# Patient Record
Sex: Female | Born: 1955 | Race: Black or African American | Hispanic: No | Marital: Married | Smoking: Never smoker
Health system: Southern US, Community
[De-identification: ages and names within clinical notes are randomized; demographics above are authoritative.]

## PROBLEM LIST (undated history)

## (undated) DIAGNOSIS — I2510 Atherosclerotic heart disease of native coronary artery without angina pectoris: Secondary | ICD-10-CM

## (undated) DIAGNOSIS — E785 Hyperlipidemia, unspecified: Secondary | ICD-10-CM

## (undated) DIAGNOSIS — I340 Nonrheumatic mitral (valve) insufficiency: Secondary | ICD-10-CM

## (undated) DIAGNOSIS — Z9289 Personal history of other medical treatment: Secondary | ICD-10-CM

## (undated) DIAGNOSIS — I10 Essential (primary) hypertension: Secondary | ICD-10-CM

## (undated) DIAGNOSIS — I459 Conduction disorder, unspecified: Secondary | ICD-10-CM

## (undated) HISTORY — DX: Nonrheumatic mitral (valve) insufficiency: I34.0

## (undated) HISTORY — DX: Hyperlipidemia, unspecified: E78.5

## (undated) HISTORY — DX: Conduction disorder, unspecified: I45.9

## (undated) HISTORY — DX: Personal history of other medical treatment: Z92.89

## (undated) HISTORY — DX: Atherosclerotic heart disease of native coronary artery without angina pectoris: I25.10

---

## 2015-11-10 ENCOUNTER — Encounter (HOSPITAL_COMMUNITY): Payer: Self-pay

## 2015-11-10 ENCOUNTER — Inpatient Hospital Stay (HOSPITAL_COMMUNITY)
Admission: EM | Admit: 2015-11-10 | Discharge: 2015-11-12 | DRG: 244 | Disposition: A | Discharge status: Home / Self Care | Payer: Self-pay | Attending: Cardiology | Admitting: Cardiology

## 2015-11-10 ENCOUNTER — Emergency Department (HOSPITAL_COMMUNITY): Payer: Self-pay

## 2015-11-10 DIAGNOSIS — Z79899 Other long term (current) drug therapy: Secondary | ICD-10-CM

## 2015-11-10 DIAGNOSIS — Z95810 Presence of automatic (implantable) cardiac defibrillator: Secondary | ICD-10-CM

## 2015-11-10 DIAGNOSIS — Z7902 Long term (current) use of antithrombotics/antiplatelets: Secondary | ICD-10-CM

## 2015-11-10 DIAGNOSIS — Z6836 Body mass index (BMI) 36.0-36.9, adult: Secondary | ICD-10-CM

## 2015-11-10 DIAGNOSIS — I441 Atrioventricular block, second degree: Principal | ICD-10-CM | POA: Diagnosis present

## 2015-11-10 DIAGNOSIS — I2510 Atherosclerotic heart disease of native coronary artery without angina pectoris: Secondary | ICD-10-CM | POA: Diagnosis present

## 2015-11-10 DIAGNOSIS — Z7982 Long term (current) use of aspirin: Secondary | ICD-10-CM

## 2015-11-10 DIAGNOSIS — I119 Hypertensive heart disease without heart failure: Secondary | ICD-10-CM | POA: Diagnosis present

## 2015-11-10 DIAGNOSIS — R42 Dizziness and giddiness: Secondary | ICD-10-CM

## 2015-11-10 DIAGNOSIS — E669 Obesity, unspecified: Secondary | ICD-10-CM | POA: Diagnosis present

## 2015-11-10 DIAGNOSIS — Z950 Presence of cardiac pacemaker: Secondary | ICD-10-CM | POA: Diagnosis not present

## 2015-11-10 DIAGNOSIS — R0789 Other chest pain: Secondary | ICD-10-CM | POA: Diagnosis present

## 2015-11-10 HISTORY — DX: Essential (primary) hypertension: I10

## 2015-11-10 LAB — I-STAT TROPONIN, ED: Troponin i, poc: 0 ng/mL (ref 0.00–0.08)

## 2015-11-10 LAB — BASIC METABOLIC PANEL
Anion gap: 11 (ref 5–15)
BUN: 15 mg/dL (ref 6–20)
CO2: 28 mmol/L (ref 22–32)
CREATININE: 0.78 mg/dL (ref 0.44–1.00)
Calcium: 10.2 mg/dL (ref 8.9–10.3)
Chloride: 101 mmol/L (ref 101–111)
Glucose, Bld: 118 mg/dL — ABNORMAL HIGH (ref 65–99)
POTASSIUM: 3.6 mmol/L (ref 3.5–5.1)
SODIUM: 140 mmol/L (ref 135–145)

## 2015-11-10 LAB — CBC
HCT: 36 % (ref 36.0–46.0)
Hemoglobin: 12.1 g/dL (ref 12.0–15.0)
MCH: 25.8 pg — ABNORMAL LOW (ref 26.0–34.0)
MCHC: 33.6 g/dL (ref 30.0–36.0)
MCV: 76.8 fL — ABNORMAL LOW (ref 78.0–100.0)
PLATELETS: 282 10*3/uL (ref 150–400)
RBC: 4.69 MIL/uL (ref 3.87–5.11)
RDW: 14.3 % (ref 11.5–15.5)
WBC: 6.9 10*3/uL (ref 4.0–10.5)

## 2015-11-10 NOTE — ED Notes (Signed)
Lequita HaltMorgan, charge RN aware of pts HR. Pt to be taken to next available room.

## 2015-11-10 NOTE — ED Notes (Signed)
Lequita HaltMorgan, charge RN informed Dr. Ethelda ChickJacubowitz of pts HR.

## 2015-11-10 NOTE — ED Provider Notes (Signed)
CSN: [id number]     Arrival date & time 11/10/15  2248 History  By signing my name below, I, Arianna Nassar, attest that this documentation has been prepared under the direction and in the presence of Tomasita CrumbleAdeleke Jazmynn Pho, MD. Electronically Signed: Octavia HeirArianna Nassar, ED Scribe. 11/10/2015. 11:54 PM.     Chief Complaint  Patient presents with  . Chest Pain      The history is provided by the patient. No language interpreter was used.   HPI Comments: Christine Cross is a 60 y.o. female who has a hx of HTN and heart murmur presents to the Emergency Department complaining of intermittent, gradual worsening, moderate left sided chest pain onset one week ago. Pt notes associated dizziness, numbness in bilateral legs and palpitations. Pt says she is concerned with her legs feeling very rough. Daughter states pt will occasionally have bradycardia and will usually drink a soda to alleviate it but she did not do that tonight. She currently takes Metoprolol and Amlodipine.  Pt does not have a cardiologist here. Denies any other symptoms.   Past Medical History  Diagnosis Date  . Murmur   . Hypertension   . Angina at rest [facility](HCC)    History reviewed. No pertinent past surgical history. No family history on file. Social History  Substance Use Topics  . Smoking status: Never Smoker   . Smokeless tobacco: None  . Alcohol Use: No   OB History    No data available     Review of Systems  A complete 10 system review of systems was obtained and all systems are negative except as noted in the HPI and PMH.    Allergies  Review of patient's allergies indicates not on file.  Home Medications   Prior to Admission medications   Not on File   Triage vitals: BP 214/87 mmHg  Pulse 35  Temp(Src) 98.3 F (36.8 C) (Oral)  Resp 18  SpO2 97% Physical Exam  Constitutional: She is oriented to person, place, and time. She appears well-developed and well-nourished. No distress.  HENT:  Head: Normocephalic and  atraumatic.  Nose: Nose normal.  Mouth/Throat: Oropharynx is clear and moist. No oropharyngeal exudate.  Eyes: Conjunctivae and EOM are normal. Pupils are equal, round, and reactive to light. No scleral icterus.  Neck: Normal range of motion. Neck supple. No JVD present. No tracheal deviation present. No thyromegaly present.  Cardiovascular: Regular rhythm and normal heart sounds.  Bradycardia present.  Exam reveals no gallop and no friction rub.   No murmur heard. Pulmonary/Chest: Effort normal and breath sounds normal. No respiratory distress. She has no wheezes. She exhibits no tenderness.  Abdominal: Soft. Bowel sounds are normal. She exhibits no distension and no mass. There is no tenderness. There is no rebound and no guarding.  Musculoskeletal: Normal range of motion. She exhibits no edema or tenderness.  Lymphadenopathy:    She has no cervical adenopathy.  Neurological: She is alert and oriented to person, place, and time. No cranial nerve deficit. She exhibits normal muscle tone.  Skin: Skin is warm and dry. No rash noted. No erythema. No pallor.  Nursing note and vitals reviewed.   ED Course  Procedures  DIAGNOSTIC STUDIES: Oxygen Saturation is 97% on RA, normal by my interpretation.  COORDINATION OF CARE:  11:39 PM Discussed treatment plan with pt at bedside and pt agreed to plan.  Labs Review Labs Reviewed  BASIC METABOLIC PANEL - Abnormal; Notable for the following:    Glucose, Bld 118 (*)  All other components within normal limits  CBC - Abnormal; Notable for the following:    MCV 76.8 (*)    MCH 25.8 (*)    All other components within normal limits  I-STAT TROPOININ, ED    Imaging Review Dg Chest Portable 1 View  11/11/2015  CLINICAL DATA:  Sharp left-sided chest pain radiating into the left arm. Onset 3 days ago. Bradycardia. EXAM: PORTABLE CHEST 1 VIEW COMPARISON:  None. FINDINGS: Prominent left heart contours and moderate cardiomegaly. The lungs are clear.   No large effusions.  No pneumothorax. IMPRESSION: Prominent left heart contours, particularly in the region of the pulmonary outflow tract. Moderate cardiomegaly. Electronically Signed   By: Ellery Plunk M.D.   On: 11/11/2015 00:26   I have personally reviewed and evaluated these images and lab results as part of my medical decision-making.   EKG Interpretation   Date/Time:  Thursday November 10 2015 23:01:55 EDT Ventricular Rate:  36 PR Interval:  168 QRS Duration: 84 QT Interval:  518 QTC Calculation: 400 R Axis:   32 Text Interpretation:  2:1 AV block No old tracing to compare Confirmed by  Erroll Luna [phone]) on 11/10/2015 11:27:19 PM      MDM   Final diagnoses:  None     Patient presents to the ED for chest pain, light headedness and BLE numbness all going on for about 1 month.  EKG reveals a 2:1 AV block.  May be due to her metoprolol and amlodipine medications.  She will require admission for further care.  I spoke with cardiology who will evaluate the patient in the ED.  2:34 AM Cardiology has accepted the patient for admission and possible pacemaker placement.  CRITICAL CARE Performed by: Tomasita Crumble   Total critical care time: 40 minutes - bradycardia HR<40  Critical care time was exclusive of separately billable procedures and treating other patients.  Critical care was necessary to treat or prevent imminent or life-threatening deterioration.  Critical care was time spent personally by me on the following activities: development of treatment plan with patient and/or surrogate as well as nursing, discussions with consultants, evaluation of patient's response to treatment, examination of patient, obtaining history from patient or surrogate, ordering and performing treatments and interventions, ordering and review of laboratory studies, ordering and review of radiographic studies, pulse oximetry and re-evaluation of patient's condition.   I personally  performed the services described in this documentation, which was scribed in my presence. The recorded information has been reviewed and is accurate.     Tomasita Crumble, MD 11/11/15 [phone]

## 2015-11-10 NOTE — ED Notes (Signed)
Pt reports sharp, left sided chest pain that radiates down her left arm and into her neck, onset 3 days ago. Pt bradycardiac with rate of 36 at triage. She reports dizziness.

## 2015-11-11 ENCOUNTER — Other Ambulatory Visit: Payer: Self-pay | Admitting: Physician Assistant

## 2015-11-11 ENCOUNTER — Encounter (HOSPITAL_COMMUNITY): Payer: Self-pay | Admitting: Internal Medicine

## 2015-11-11 ENCOUNTER — Inpatient Hospital Stay (HOSPITAL_COMMUNITY): Payer: Self-pay

## 2015-11-11 ENCOUNTER — Encounter (HOSPITAL_COMMUNITY)
Admission: EM | Disposition: A | Discharge status: Home / Self Care | Payer: Self-pay | Source: Home / Self Care | Attending: Cardiology

## 2015-11-11 DIAGNOSIS — R0789 Other chest pain: Secondary | ICD-10-CM

## 2015-11-11 DIAGNOSIS — I119 Hypertensive heart disease without heart failure: Secondary | ICD-10-CM

## 2015-11-11 DIAGNOSIS — R079 Chest pain, unspecified: Secondary | ICD-10-CM

## 2015-11-11 DIAGNOSIS — I441 Atrioventricular block, second degree: Principal | ICD-10-CM

## 2015-11-11 HISTORY — PX: EP IMPLANTABLE DEVICE: SHX172B

## 2015-11-11 LAB — CBC
HCT: 37 % (ref 36.0–46.0)
Hemoglobin: 12.6 g/dL (ref 12.0–15.0)
MCH: 25.9 pg — ABNORMAL LOW (ref 26.0–34.0)
MCHC: 34.1 g/dL (ref 30.0–36.0)
MCV: 76.1 fL — AB (ref 78.0–100.0)
PLATELETS: 247 10*3/uL (ref 150–400)
RBC: 4.86 MIL/uL (ref 3.87–5.11)
RDW: 14.3 % (ref 11.5–15.5)
WBC: 5.6 10*3/uL (ref 4.0–10.5)

## 2015-11-11 LAB — HEPATIC FUNCTION PANEL
ALK PHOS: 66 U/L (ref 38–126)
ALT: 39 U/L (ref 14–54)
AST: 20 U/L (ref 15–41)
Albumin: 3.5 g/dL (ref 3.5–5.0)
Total Bilirubin: 0.5 mg/dL (ref 0.3–1.2)
Total Protein: 6.8 g/dL (ref 6.5–8.1)

## 2015-11-11 LAB — BASIC METABOLIC PANEL
ANION GAP: 11 (ref 5–15)
BUN: 10 mg/dL (ref 6–20)
CALCIUM: 9.5 mg/dL (ref 8.9–10.3)
CHLORIDE: 107 mmol/L (ref 101–111)
CO2: 26 mmol/L (ref 22–32)
Creatinine, Ser: 0.45 mg/dL (ref 0.44–1.00)
GFR calc non Af Amer: >60 mL/min (ref >60)
Glucose, Bld: 125 mg/dL — ABNORMAL HIGH (ref 65–99)
Potassium: 3.2 mmol/L — ABNORMAL LOW (ref 3.5–5.1)
SODIUM: 144 mmol/L (ref 135–145)

## 2015-11-11 LAB — ECHOCARDIOGRAM COMPLETE
Height: 75 in
WEIGHTICAEL: 4675.2 [oz_av]

## 2015-11-11 LAB — PROTIME-INR
INR: 1.06 (ref 0.00–1.49)
PROTHROMBIN TIME: 14 s (ref 11.6–15.2)

## 2015-11-11 LAB — LIPID PANEL
CHOL/HDL RATIO: 6.7 ratio
CHOLESTEROL: 234 mg/dL — AB (ref 0–200)
HDL: 35 mg/dL — AB (ref >40)
LDL CALC: 184 mg/dL — AB (ref 0–99)
TRIGLYCERIDES: 75 mg/dL (ref <150)
VLDL: 15 mg/dL (ref 0–40)

## 2015-11-11 LAB — TROPONIN I
Troponin I: 0.03 ng/mL (ref <0.031)
Troponin I: <0.03 ng/mL (ref <0.031)
Troponin I: 0.1 ng/mL — ABNORMAL HIGH (ref <0.031)

## 2015-11-11 LAB — TSH: TSH: 1.424 u[IU]/mL (ref 0.350–4.500)

## 2015-11-11 LAB — MAGNESIUM: Magnesium: 1.8 mg/dL (ref 1.7–2.4)

## 2015-11-11 LAB — MRSA PCR SCREENING: MRSA BY PCR: NEGATIVE

## 2015-11-11 LAB — T4, FREE: Free T4: 1.1 ng/dL (ref 0.61–1.12)

## 2015-11-11 SURGERY — PACEMAKER IMPLANT
Anesthesia: LOCAL

## 2015-11-11 MED ORDER — METOPROLOL TARTRATE 1 MG/ML IV SOLN
INTRAVENOUS | Status: AC
  Filled 2015-11-11: qty 5

## 2015-11-11 MED ORDER — LIDOCAINE HCL (PF) 1 % IJ SOLN
INTRAMUSCULAR | Status: DC | PRN
  Administered 2015-11-11: 60 mL via INTRADERMAL

## 2015-11-11 MED ORDER — OFF THE BEAT BOOK
Freq: Once | Status: DC
  Filled 2015-11-11: qty 1

## 2015-11-11 MED ORDER — DEXTROSE 5 % IV SOLN
3.0000 g | INTRAVENOUS | Status: AC
  Administered 2015-11-11: 3 g via INTRAVENOUS
  Filled 2015-11-11 (×2): qty 3000

## 2015-11-11 MED ORDER — ACETAMINOPHEN 325 MG PO TABS: 650.0000 mg | ORAL_TABLET | ORAL | Status: DC | PRN

## 2015-11-11 MED ORDER — HYDRALAZINE HCL 20 MG/ML IJ SOLN
10.0000 mg | Freq: Four times a day (QID) | INTRAMUSCULAR | Status: DC | PRN
  Administered 2015-11-11 – 2015-11-12 (×3): 10 mg via INTRAVENOUS
  Filled 2015-11-11 (×4): qty 1

## 2015-11-11 MED ORDER — CEFAZOLIN SODIUM 1-5 GM-% IV SOLN
1.0000 g | Freq: Four times a day (QID) | INTRAVENOUS | Status: AC
Start: 2015-11-11 — End: 2015-11-12
  Administered 2015-11-11 – 2015-11-12 (×3): 1 g via INTRAVENOUS
  Filled 2015-11-11 (×4): qty 50

## 2015-11-11 MED ORDER — ONDANSETRON HCL 4 MG/2ML IJ SOLN: 4.0000 mg | Freq: Four times a day (QID) | INTRAMUSCULAR | Status: DC | PRN

## 2015-11-11 MED ORDER — CHLORHEXIDINE GLUCONATE 4 % EX LIQD: 60.0000 mL | Freq: Once | CUTANEOUS | Status: DC

## 2015-11-11 MED ORDER — SODIUM CHLORIDE 0.9% FLUSH
3.0000 mL | Freq: Two times a day (BID) | INTRAVENOUS | Status: DC
  Administered 2015-11-11: 3 mL via INTRAVENOUS

## 2015-11-11 MED ORDER — SODIUM CHLORIDE 0.9 % IV SOLN: 250.0000 mL | INTRAVENOUS | Status: DC

## 2015-11-11 MED ORDER — TRAZODONE HCL 50 MG PO TABS
50.0000 mg | ORAL_TABLET | Freq: Once | ORAL | Status: AC
  Administered 2015-11-11: 50 mg via ORAL
  Filled 2015-11-11: qty 1

## 2015-11-11 MED ORDER — LIDOCAINE HCL (PF) 1 % IJ SOLN
INTRAMUSCULAR | Status: AC
  Filled 2015-11-11: qty 30

## 2015-11-11 MED ORDER — ACETAMINOPHEN 325 MG PO TABS
325.0000 mg | ORAL_TABLET | ORAL | Status: DC | PRN
  Administered 2015-11-11 – 2015-11-12 (×3): 650 mg via ORAL
  Filled 2015-11-11 (×3): qty 2

## 2015-11-11 MED ORDER — GENTAMICIN SULFATE 40 MG/ML IJ SOLN
INTRAMUSCULAR | Status: AC
  Filled 2015-11-11: qty 2

## 2015-11-11 MED ORDER — HEPARIN (PORCINE) IN NACL 2-0.9 UNIT/ML-% IJ SOLN
INTRAMUSCULAR | Status: DC | PRN
  Administered 2015-11-11: 12:00:00

## 2015-11-11 MED ORDER — SODIUM CHLORIDE 0.9 % IV SOLN
INTRAVENOUS | Status: DC
  Administered 2015-11-11: 10:00:00 via INTRAVENOUS

## 2015-11-11 MED ORDER — MIDAZOLAM HCL 5 MG/5ML IJ SOLN
INTRAMUSCULAR | Status: AC
  Filled 2015-11-11: qty 5

## 2015-11-11 MED ORDER — SODIUM CHLORIDE 0.9% FLUSH: 3.0000 mL | INTRAVENOUS | Status: DC | PRN

## 2015-11-11 MED ORDER — MIDAZOLAM HCL 5 MG/5ML IJ SOLN
INTRAMUSCULAR | Status: DC | PRN
  Administered 2015-11-11 (×5): 1 mg via INTRAVENOUS

## 2015-11-11 MED ORDER — METOPROLOL TARTRATE 1 MG/ML IV SOLN
INTRAVENOUS | Status: DC | PRN
  Administered 2015-11-11: 5 mg via INTRAVENOUS

## 2015-11-11 MED ORDER — FENTANYL CITRATE (PF) 100 MCG/2ML IJ SOLN
INTRAMUSCULAR | Status: DC | PRN
  Administered 2015-11-11: 25 ug via INTRAVENOUS
  Administered 2015-11-11 (×3): 12.5 ug via INTRAVENOUS

## 2015-11-11 MED ORDER — CLOPIDOGREL BISULFATE 75 MG PO TABS
75.0000 mg | ORAL_TABLET | Freq: Every day | ORAL | Status: DC
  Administered 2015-11-12: 09:00:00 75 mg via ORAL
  Filled 2015-11-11 (×2): qty 1

## 2015-11-11 MED ORDER — ASPIRIN EC 81 MG PO TBEC
81.0000 mg | DELAYED_RELEASE_TABLET | Freq: Every day | ORAL | Status: DC
Start: 2015-11-11 — End: 2015-11-12
  Administered 2015-11-12: 81 mg via ORAL
  Filled 2015-11-11 (×2): qty 1

## 2015-11-11 MED ORDER — HEPARIN (PORCINE) IN NACL 2-0.9 UNIT/ML-% IJ SOLN
INTRAMUSCULAR | Status: AC
  Filled 2015-11-11: qty 500

## 2015-11-11 MED ORDER — SODIUM CHLORIDE 0.9 % IR SOLN: 80.0000 mg | Status: DC

## 2015-11-11 MED ORDER — FENTANYL CITRATE (PF) 100 MCG/2ML IJ SOLN
INTRAMUSCULAR | Status: AC
  Filled 2015-11-11: qty 2

## 2015-11-11 MED ORDER — POTASSIUM CHLORIDE CRYS ER 20 MEQ PO TBCR
40.0000 meq | EXTENDED_RELEASE_TABLET | Freq: Once | ORAL | Status: AC
  Administered 2015-11-11: 18:00:00 40 meq via ORAL
  Filled 2015-11-11: qty 2

## 2015-11-11 SURGICAL SUPPLY — 10 items
CABLE SURGICAL S-101-97-12 (CABLE) ×2 IMPLANT
HOVERMATT SINGLE USE (MISCELLANEOUS) ×2 IMPLANT
INGEVITY MRI 7740-45CM (Lead) ×2 IMPLANT
INGEVITY MRI 7741-52CM (Lead) ×2 IMPLANT
LEAD PACING INGEVITY MRI 45CM (Lead) ×1 IMPLANT
LEAD PACING INGEVITY MRI 52CM (Lead) ×1 IMPLANT
PACEMAKER ACCOLADE DR-EL (Pacemaker) ×2 IMPLANT
PAD DEFIB LIFELINK (PAD) ×2 IMPLANT
SHEATH CLASSIC 7F (SHEATH) ×4 IMPLANT
TRAY PACEMAKER INSERTION (PACKS) ×2 IMPLANT

## 2015-11-11 NOTE — Progress Notes (Signed)
Echocardiogram 2D Echocardiogram has been performed.  Dorothey BasemanReel, Murlin Schrieber M 11/11/2015, 10:08 AM

## 2015-11-11 NOTE — Progress Notes (Signed)
Notified Dr. Ladona Ridgelaylor that potassium 3.2 and received orders to give 40 meq po potassium to patient and to hold plavix and aspirin today and restart plavix and aspirin in am tomorrow.

## 2015-11-11 NOTE — Discharge Summary (Signed)
DISCHARGE SUMMARY    Patient ID: Christine Cross,  MRN: [id number], DOB/AGE: 03-Feb-1956 60 y.o.  Admit date: 11/10/2015 Discharge date: 11/12/2015  Primary Care Physician: none in [place] Primary Cardiologist: none in [place] Electrophysiologist: new this admit to Dr. Ladona Ridgel  Primary Discharge Diagnosis:  1. 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 HTN  No Known Allergies   Procedures This Admission:  1.  Implantation of a [employer] dual chamber PPM on 11/11/15 by Dr Ladona Ridgel.  The patient received a [employer] (serial number [id number]) pacemaker with [facility] (serial number [id number]) right atrial lead and a [employer] (serial number [id number]) right ventricular lead There were no immediate post procedure complications. 2.  CXR on 4/15 demonstrated no pneumothorax status post device implantation.   Brief HPI: Christine Cross is a 60 y.o. female was admitted to [facility] 11/10/15 with c/o increasing weakness and fatigue, noted to have 2:1 heart block and admitted for further care and management, ultimately getting PPM implant 11/11/15 by Dr. Ladona Ridgel.  Hospital Course:  The patient is visiting her daughter coming from [place] where she lives permanently, with plans to be in the [place] for about 4-5 months.  She was admitted to ICU for close observation, her home Toprol held. She continued with high degree AVblock with 2:1 and 3:1 conduction with HR in 30's-40's despite holding her Toprol >36hours. Her BP remained stable noting hypertension treated with PRN hydralazine. Her CC is that she came with about a week or longer of generalized fatigue, weakness, that in the last couple days has become notably worse, and getting SOB with minimal exertion, she and her daughter noted her HR to be slow and she came in to be evaluated. She has had dizzy spells upon standing, and reports about a month ago once upon  standing felt everything go black for a few seconds but did not fall or have full syncope The patient had some c/o CP that date back 3 years located it central and can radiate to her L shoulder, it is sharp and shooting, lasts only a moment at a time but can be repetative on/off for a few minutes duration. It can happen at rest or when up and around, is not necessarily exertional or positional but when she has it, lifting or moving her left arm is painful and makes it worse. She had a stress test for this 3 years ago in [place], was started at that time on her current regime of meds but has never had a cardiac cath.  She had negative troponin x2 and has been arranged to have out patient stress testing done.   She had an echo done that noted: - LVEF 60-65%, moderate LVH, normal wall motion, diastolic  dysfunction, indeterminate LV filling pressure, elevated aortic  valve velocities without stenosis, trileaflet aortic valve,  mildly thickened mitral leaflets with moderate MR, mild LAE,  moderate RAE, mild TR, RVSP 30 mmHg + RAP, IVC not visualized.  She was seen in consult by EP and underwent PPM implant by Dr. Ladona Ridgel as noted 11/11/15 with details noted above. The patient was monitored overnight post PPM implant on telemetry noting p synchronous ventricular pacing.  Her device was interrogated the day after her implant with normal device function, CXR post implant is without pneumothorax.  Her PPM implant site is stable without hematoma.  Wound care and activity instructions  were discussed with the patient.  She was seen and examined by Dr. Ladona Ridgelaylor and felt stable for discharge.  Wound check, stress testing and f/u appointments have been arranged.   Physical Exam: Filed Vitals:   11/11/15 2200 11/12/15 0439 11/12/15 0440 11/12/15 0550  BP: 147/55 169/98 169/98 172/76  Pulse: 78     Temp:  97.6 F (36.4 C)    TempSrc:  Oral    Resp: 19  18 23   Height:      Weight:      SpO2: 97% 97%      GEN- The patient is well appearing, alert and oriented x 3 today.   HEENT: normocephalic, atraumatic; sclera clear, conjunctiva pink; hearing intact; oropharynx clear; neck supple, no JVP Lungs- Clear to ausculation bilaterally, normal work of breathing.  No wheezes, rales, rhonchi Heart- Regular rate and rhythm, no murmurs, rubs or gallops, PMI not laterally displaced GI- soft, non-tender, non-distended, bowel sounds present, no hepatosplenomegaly Extremities- no clubbing, cyanosis, or edema; DP/PT/radial pulses 2+ bilaterally MS- no significant deformity or atrophy Skin- warm and dry, no rash or lesion, left chest without hematoma/ecchymosis Psych- euthymic mood, full affect Neuro- no gross deficits   Labs:   Lab Results  Component Value Date   WBC 5.6 11/11/2015   HGB 12.6 11/11/2015   HCT 37.0 11/11/2015   MCV 76.1* 11/11/2015   PLT 247 11/11/2015     Recent Labs Lab 11/11/15 0346 11/11/15 0830  NA  --  144  K  --  3.2*  CL  --  107  CO2  --  26  BUN  --  10  CREATININE  --  0.45  CALCIUM  --  9.5  PROT 6.8  --   BILITOT 0.5  --   ALKPHOS 66  --   ALT 39  --   AST 20  --   GLUCOSE  --  125*    Discharge Medications:    Medication List    STOP taking these medications        metoprolol succinate 50 MG 24 hr tablet  Commonly known as:  TOPROL-XL      TAKE these medications        amLODipine 5 MG tablet  Commonly known as:  NORVASC  Take 5 mg by mouth daily.     aspirin EC 81 MG tablet  Take 81 mg by mouth daily.     carvedilol 6.25 MG tablet  Commonly known as:  COREG  Take 1 tablet (6.25 mg total) by mouth 2 (two) times daily with a meal.     clopidogrel 75 MG tablet  Commonly known as:  PLAVIX  Take 75 mg by mouth daily.     ISOSORBIDE DINITRATE SL  Place 10 mg under the tongue 2 (two) times daily.        Disposition: Home   Follow-up Information    Follow [facility] [address] [facility] On 11/17/2015.   Specialty:   Cardiology   Why:  12:00noon, wound check   Contact information:   [address], [address] [place] [id number] [phone]      [facility] [address] [facility] On 11/21/2015.   Specialty:  Cardiology   Why:  12:30PM, part one of stress test   Contact information:   [address], [address] [place] [id number] [phone]      [facility] [address] [facility] On 11/22/2015.  Specialty:  Cardiology   Why:  2:00PM, part two of stress test   Contact information:   [address], [address] [place] [address] [phone]      Follow up with Lewayne Bunting, MD On 01/17/2016.   Specialty:  Cardiology   Why:  1:45PM   Contact information:   [address] [place] [address] [phone]      Will plan to stop metoprolol and switch to coreg 6. 25 mg twice daily.  Duration of Discharge Encounter: Greater than 30 minutes including physician time.  Signed, Lewayne Bunting, M.D. 11/12/2015 8:37 AM

## 2015-11-11 NOTE — H&P (View-Only) (Signed)
ELECTROPHYSIOLOGY CONSULT NOTE    Patient ID: Christine KehrJoyce Chuang MRN: [id number], DOB/AGE: 03-28-1956 60 y.o.  Admit date: 11/10/2015 Date of Consult: 11/11/2015  Primary Physician: No PCP Per Patient Primary Cardiologist: none (visiting from [place])  Reason for Consultation: heart block, bradycardia  HPI: Christine Cross is a 60 y.o. female is visiting here from [place] for the next 4-885months, has PMHx of HTN and a heart murmur, she has long history of palpitations and stated many years ago on Toprol XL for the palpitations that were well controlled with it.  She reports having no palpitations for so long, about 6 months or so ago she stopped the Toprol and had recurrent palpitations and resumed it about 3-4 months ago.  She reports about 3 years ago starting to feel CP, locates it central and can radiate to her L shoulder, it is sharp and shooting, lasts only a moment at a time but can be repetative on/off for a few minutes duration.  It can happen at rest or when up and around, is not necessarily exertional or positional but when she has it, lifting or moving her left arm is painful and makes it worse.  She had a stress test for this 3 years ago in [place], was started at that time on her current regime of meds but has never had a cardiac cath and her CP continues for the most part unchanged and feel it most days over the years.   [facility] now with about a week or longer of generalized fatigue, weakness, that in the last couple days has become notably worse, and getting SOB with minimal exertion, she and her daughter noted her HR to be slow and she came in to be evaluated.  She has had dizzy spells upon standing, and reports about a month ago once upon standing felt everything go black for a few seconds but did not fall or have full syncope.  No other hx of near syncope or syncope.  On the Toprol her palpitations have been resolved.    She denies any recent fever or illness  Notable  labs: poc Trop 0.00, Trop I 4 hrs after 0.03 LDL 184 TSH and free t4 are wnl CBC/BMET are unremarkable   Past Medical History  Diagnosis Date  . Murmur   . Hypertension   . Angina at [facility](HCC)      Surgical History: History reviewed. No pertinent past surgical history.   Prescriptions prior to admission  Medication Sig Dispense Refill Last Dose  . amLODipine (NORVASC) 5 MG tablet Take 5 mg by mouth daily.   11/10/2015 at Unknown time  . aspirin EC 81 MG tablet Take 81 mg by mouth daily.   11/10/2015 at Unknown time  . clopidogrel (PLAVIX) 75 MG tablet Take 75 mg by mouth daily.   11/09/2015 at Unknown time  . ISOSORBIDE DINITRATE SL Place 10 mg under the tongue 2 (two) times daily.   11/10/2015 at Unknown time  . metoprolol succinate (TOPROL-XL) 50 MG 24 hr tablet Take 50 mg by mouth daily. Take with or immediately following a meal.   11/09/2015 at 2100    Inpatient Medications:  . aspirin EC  81 mg Oral Daily  . clopidogrel  75 mg Oral Daily    Allergies: No Known Allergies  Social History   Social History  . Marital Status: Married    Spouse Name: N/A  . Number of Children: N/A  . Years of Education: N/A  Occupational History  . Not on file.   Social History Main Topics  . Smoking status: Never Smoker   . Smokeless tobacco: Not on file  . Alcohol Use: No  . Drug Use: Not on file  . Sexual Activity: Not on file   Other Topics Concern  . Not on file   Social History Narrative  . No narrative on file     No family history on file.   Review of Systems: All other systems reviewed and are otherwise negative except as noted above.  Physical Exam: Filed Vitals:   11/11/15 0329 11/11/15 0400 11/11/15 0800 11/11/15 0825  BP: 187/57 187/57 188/67   Pulse:  38  40  Temp: 98.2 F (36.8 C)   98.3 F (36.8 C)  TempSrc: Oral   Oral  Resp: Height:      Weight:      SpO2: 94% 95%  95%    GEN- The patient is obese, well appearing, alert and oriented  x 3 today.   HEENT: normocephalic, atraumatic; sclera clear, conjunctiva pink; hearing intact; oropharynx clear; neck supple, no JVP Lymph- no cervical lymphadenopathy Lungs- Clear to ausculation bilaterally, normal work of breathing.  No wheezes, rales, rhonchi Heart- Regular rate and rhythm, bradycardic, 2/6 SM,  No rubs or gallops, PMI not laterally displaced GI- soft, non-tender, non-distended Extremities- no clubbing, cyanosis, or edema MS- no significant deformity or atrophy Skin- warm and dry, no rash or lesion Psych- euthymic mood, full affect Neuro- no gross deficits observed  Labs:   Lab Results  Component Value Date   WBC 6.9 11/10/2015   HGB 12.1 11/10/2015   HCT 36.0 11/10/2015   MCV 76.8* 11/10/2015   PLT 282 11/10/2015     Recent Labs Lab 11/10/15 2310 11/11/15 0346  NA 140  --   K 3.6  --   CL 101  --   CO2 28  --   BUN 15  --   CREATININE 0.78  --   CALCIUM 10.2  --   PROT  --  6.8  BILITOT  --  0.5  ALKPHOS  --  66  ALT  --  39  AST  --  20  GLUCOSE 118*  --       Radiology/Studies: Dg Chest Portable 1 View  11/11/2015  CLINICAL DATA:  Sharp left-sided chest pain radiating into the left arm. Onset 3 days ago. Bradycardia. EXAM: PORTABLE CHEST 1 VIEW COMPARISON:  None. FINDINGS: Prominent left heart contours and moderate cardiomegaly. The lungs are clear.  No large effusions.  No pneumothorax. IMPRESSION: Prominent left heart contours, particularly in the region of the pulmonary outflow tract. Moderate cardiomegaly. Electronically Signed   By: Ellery Plunk M.D.   On: 11/11/2015 00:26    EKG: 2:1 heart block, 35bpm  TELEMETRY: remains generally 2:1 heart block 30's-40's, noted 3:1 conduction as well. Echocardiogram is pending  Assessment and Plan:   1. High degree heart block She has longstanding hx of palpitations that she was started years ago on Toprol for, never anything more specific has been told to her other then "an irregular heart  beat"     She has been on Toprol XL  qPM last dose 11/10/15 night-time     If she remains in heart block towards the afternoon today will need PPM implant, will hold NPO for now     I have asked for echo to be done ASAP  2. HTN  Continue PRN hydralazine  3. CP     Sounds somewhat atypical, reports an abnormal stress test in [place] 3 years ago for same symptom and started on meds, never cathed      poc Trop 0.00, Trop I 4hrs after 0.03         Signed, Francis Dowse, Cordelia Poche 11/11/2015   EP Attending  Patient seen and examined. I have reviewed the findings as noted by Francis Dowse PA-C and concur with her findings. The patient's exam reveals a regular bradycardia. Lungs are clear, abdomen is obese but soft and non-tender and there is no edema. Neuro is non-focal although she is appropriately anxious. ECG demonstrates NSR with 2:1 AV block.  A/P 1. 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 I have discussed the risks/benefits/goals/expectations of PPM insertion and she wishes to proceed.  2. HTN - her blood pressure is quite high and I would anticipate starting coreg after PPM is inserted and stopping toprol. 3. Obesity - she will need to lose weight 4. Chest pain - this has been present for several years and is atypical. I would anticipate she undergo stress testing before she goes back to [place].  Kaydan Wilhoite,M.D. 9:44 AM

## 2015-11-11 NOTE — ED Notes (Signed)
Upon encounter this RN found patient to have HR @ 35 BPM. At this time patient doesn't endorse symptoms, but explains that over the past few days she has experienced dizziness, upper and lower extremity numbness, chest pain, fatigue, and generalized weakness. IV started; Zoll pads applied.

## 2015-11-11 NOTE — Progress Notes (Signed)
Pt asleep O2 sats dropped into the low 80s, attempted to put 2L nasal cannula on pt. Pt refused and educated.

## 2015-11-11 NOTE — Consult Note (Addendum)
ELECTROPHYSIOLOGY CONSULT NOTE    Patient ID: Christine KehrJoyce Alpert MRN: [id number], DOB/AGE: [birth year] 60 y.o.  Admit date: 11/10/2015 Date of Consult: 11/11/2015  Primary Physician: No PCP Per Patient Primary Cardiologist: none (visiting from [place])  Reason for Consultation: heart block, bradycardia  HPI: Christine Cross is a 60 y.o. female is visiting here from [place] for the next 4-35months, has PMHx of HTN and a heart murmur, she has long history of palpitations and stated many years ago on Toprol XL for the palpitations that were well controlled with it.  She reports having no palpitations for so long, about 6 months or so ago she stopped the Toprol and had recurrent palpitations and resumed it about 3-4 months ago.  She reports about 3 years ago starting to feel CP, locates it central and can radiate to her L shoulder, it is sharp and shooting, lasts only a moment at a time but can be repetative on/off for a few minutes duration.  It can happen at rest or when up and around, is not necessarily exertional or positional but when she has it, lifting or moving her left arm is painful and makes it worse.  She had a stress test for this 3 years ago in [place], was started at that time on her current regime of meds but has never had a cardiac cath and her CP continues for the most part unchanged and feel it most days over the years.   [facility] now with about a week or longer of generalized fatigue, weakness, that in the last couple days has become notably worse, and getting SOB with minimal exertion, she and her daughter noted her HR to be slow and she came in to be evaluated.  She has had dizzy spells upon standing, and reports about a month ago once upon standing felt everything go black for a few seconds but did not fall or have full syncope.  No other hx of near syncope or syncope.  On the Toprol her palpitations have been resolved.    She denies any recent fever or illness  Notable  labs: poc Trop 0.00, Trop I 4 hrs after 0.03 LDL 184 TSH and free t4 are wnl CBC/BMET are unremarkable   Past Medical History  Diagnosis Date  . Murmur   . Hypertension   . Angina at rest [facility](HCC)      Surgical History: History reviewed. No pertinent past surgical history.   Prescriptions prior to admission  Medication Sig Dispense Refill Last Dose  . amLODipine (NORVASC) 5 MG tablet Take 5 mg by mouth daily.   11/10/2015 at Unknown time  . aspirin EC 81 MG tablet Take 81 mg by mouth daily.   11/10/2015 at Unknown time  . clopidogrel (PLAVIX) 75 MG tablet Take 75 mg by mouth daily.   11/09/2015 at Unknown time  . ISOSORBIDE DINITRATE SL Place 10 mg under the tongue 2 (two) times daily.   11/10/2015 at Unknown time  . metoprolol succinate (TOPROL-XL) 50 MG 24 hr tablet Take 50 mg by mouth daily. Take with or immediately following a meal.   11/09/2015 at 2100    Inpatient Medications:  . aspirin EC  81 mg Oral Daily  . clopidogrel  75 mg Oral Daily    Allergies: No Known Allergies  Social History   Social History  . Marital Status: Married    Spouse Name: N/A  . Number of Children: N/A  . Years of Education: N/A  Occupational History  . Not on file.   Social History Main Topics  . Smoking status: Never Smoker   . Smokeless tobacco: Not on file  . Alcohol Use: No  . Drug Use: Not on file  . Sexual Activity: Not on file   Other Topics Concern  . Not on file   Social History Narrative  . No narrative on file     Family Hx: HTN  By her mother only, no onther known medical family history  Review of Systems: All other systems reviewed and are otherwise negative except as noted above.  Physical Exam: Filed Vitals:   11/11/15 0329 11/11/15 0400 11/11/15 0800 11/11/15 0825  BP: 187/57 187/57 188/67   Pulse:  38  40  Temp: 98.2 F (36.8 C)   98.3 F (36.8 C)  TempSrc: Oral   Oral  Resp: Height:      Weight:      SpO2: 94% 95%  95%    GEN- The  patient is obese, well appearing, alert and oriented x 3 today.   HEENT: normocephalic, atraumatic; sclera clear, conjunctiva pink; hearing intact; oropharynx clear; neck supple, no JVP Lymph- no cervical lymphadenopathy Lungs- Clear to ausculation bilaterally, normal work of breathing.  No wheezes, rales, rhonchi Heart- Regular rate and rhythm, bradycardic, 2/6 SM,  No rubs or gallops, PMI not laterally displaced GI- soft, non-tender, non-distended Extremities- no clubbing, cyanosis, or edema MS- no significant deformity or atrophy Skin- warm and dry, no rash or lesion Psych- euthymic mood, full affect Neuro- no gross deficits observed  Labs:   Lab Results  Component Value Date   WBC 6.9 11/10/2015   HGB 12.1 11/10/2015   HCT 36.0 11/10/2015   MCV 76.8* 11/10/2015   PLT 282 11/10/2015     Recent Labs Lab 11/10/15 2310 11/11/15 0346  NA 140  --   K 3.6  --   CL 101  --   CO2 28  --   BUN 15  --   CREATININE 0.78  --   CALCIUM 10.2  --   PROT  --  6.8  BILITOT  --  0.5  ALKPHOS  --  66  ALT  --  39  AST  --  20  GLUCOSE 118*  --       Radiology/Studies: Dg Chest Portable 1 View  11/11/2015  CLINICAL DATA:  Sharp left-sided chest pain radiating into the left arm. Onset 3 days ago. Bradycardia. EXAM: PORTABLE CHEST 1 VIEW COMPARISON:  None. FINDINGS: Prominent left heart contours and moderate cardiomegaly. The lungs are clear.  No large effusions.  No pneumothorax. IMPRESSION: Prominent left heart contours, particularly in the region of the pulmonary outflow tract. Moderate cardiomegaly. Electronically Signed   By: Ellery Plunk M.D.   On: 11/11/2015 00:26    EKG: 2:1 heart block, 35bpm  TELEMETRY: remains generally 2:1 heart block 30's-40's, noted 3:1 conduction as well. Echocardiogram is pending  Assessment and Plan:   1. High degree heart block She has longstanding hx of palpitations that she was started years ago on Toprol for, never anything more specific  has been told to her other then "an irregular heart beat"     She has been on Toprol XL  qPM last dose 11/10/15 night-time     If she remains in heart block towards the afternoon today will need PPM implant, will hold NPO for now     I have asked for echo to  be done ASAP  2. HTN     Continue PRN hydralazine  3. CP     Sounds somewhat atypical, reports an abnormal stress test in [place] 3 years ago for same symptom and started on meds, never cathed      poc Trop 0.00, Trop I 4hrs after 0.03         Signed, Francis Dowse, Cordelia Poche 11/11/2015   EP Attending  Patient seen and examined. I have reviewed the findings as noted by Francis Dowse PA-C and concur with her findings. The patient's exam reveals a regular bradycardia. Lungs are clear, abdomen is obese but soft and non-tender and there is no edema. Neuro is non-focal although she is appropriately anxious. ECG demonstrates NSR with 2:1 AV block.  A/P 1. 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 I have discussed the risks/benefits/goals/expectations of PPM insertion and she wishes to proceed.  2. HTN - her blood pressure is quite high and I would anticipate starting coreg after PPM is inserted and stopping toprol. 3. Obesity - she will need to lose weight 4. Chest pain - this has been present for several years and is atypical. I would anticipate she undergo stress testing before she goes back to [place].  Sharlot Gowda Drewey Begue,M.D. 9:44 AM   ADDENDUM: to include family history only Francis Dowse, [place]   Lewayne Bunting, M.D.

## 2015-11-11 NOTE — Progress Notes (Signed)
Orthopedic Tech Progress Note Patient Details:  Christine Cross [birth year] [id number]  Ortho Devices Type of Ortho Device: Arm sling   Saul FordyceJennifer C Valaree Fresquez 11/11/2015, 1:52 PM

## 2015-11-11 NOTE — H&P (Signed)
History & Physical    Patient ID: Christine KehrJoyce Cross MRN: [id number], DOB/AGE: [birth year]   Admit date: 11/10/2015   Primary Physician: No PCP Per Patient Primary Cardiologist: None in [place].   Patient Profile    60 year old [place] woman with a history of HTN, palpitations, and CAD (by stress test only) who presents with 2:1 AV block.   Past Medical History    Past Medical History  Diagnosis Date  . Murmur   . Hypertension   . Angina at rest [facility](HCC)     History reviewed. No pertinent past surgical history.   Allergies  No Known Allergies  History of Present Illness    60 year old [place] woman with a history of HTN, palpitations, and CAD (by stress test only) who presents with 2:1 AV block. Ms. Christine Cross lives in [place] and came to [place] to visit her daughter (a Teacher, early years/prepharmacist) who just had a baby. Ms. Christine Cross does not have any providers in the [place]. She will be staying for about 5 months. She states that she has had chest pain and had a stress test in [place] 3 years ago. It was positive and she was put on ASA, plavix, metoprolol, and nitrates. She did not undergo cath or revascularization. She has continued to have brief episodes of CP (up to 3 seconds in duration) about 3 times per week. Over the past month she has had increased palpitations and occasionally a slow HR. The patients daughter has apparently directed either taking an additional metoprolol or skipping a day (and taking caffeine) if her HR was too low.    She felt progressively worse over the past week, with dizziness, palpitations, pre-syncope, recurrent of her typical fleeting CP, DOE. Today, her CP radiated to her arms and back which is atypical. Her HR was palpated at 34bpm at home. Due to progressive symptoms, she presented to the ER.  On arrival to the ER, she was bradycardic to 35bpm, BNP 214/87, saturating 97% on RA. BP improved somewhat without intervention. Labs were notable for K 3.6, Cr 0.78, POC TnI 0.00, WBC 6.9,  hct 36. CXR demonstated cardiomegaly but no acute process.  ECG demonstrated SR of ~75bpm with 2:1 AV block with resultant ventricular rate of 36 and a narrow QRS.   The patient and daughter deny accidentally taking too many doses of metoprolol. No tick bites. No history of thyroid disease. Patient is right handed without history of clavicular fracture or chest wall radiation. No alcohol or tobacco. The patient thought she was once told she had an abnormal heart rhythm that could lead to a clot, but she denied ever hearing the term "Atrial fibrillation."  Home Medications    Prior to Admission medications   Not on File    Family History    No family history on file.  Social History    Social History   Social History  . Marital Status: Married    Spouse Name: N/A  . Number of Children: N/A  . Years of Education: N/A   Occupational History  . Not on file.   Social History Main Topics  . Smoking status: Never Smoker   . Smokeless tobacco: Not on file  . Alcohol Use: No  . Drug Use: Not on file  . Sexual Activity: Not on file   Other Topics Concern  . Not on file   Social History Narrative  . No narrative on file     Review of Systems    General:  No chills, fever, night sweats or weight changes.  Cardiovascular: See HPI Dermatological: No rash, lesions/masses Respiratory: See HPI Urologic: No hematuria, dysuria Abdominal:   No nausea, vomiting, diarrhea, bright red blood per rectum, melena, or hematemesis Neurologic:  No visual changes, wkns, changes in mental status. All other systems reviewed and are otherwise negative except as noted above.  Physical Exam    Blood pressure 139/73, pulse 34, temperature 98.3 F (36.8 C), temperature source Oral, resp. rate 16, SpO2 100 %.  General: Pleasant, NAD Psych: Normal affect. Neuro: Alert and oriented X 3. Moves all extremities spontaneously. HEENT: Normal  Neck: Supple without bruits or JVD. Lungs:  Resp regular  and unlabored, CTA. Heart: RRR no s3, s4, or murmurs. Abdomen: Soft, non-tender, non-distended, BS + x 4.  Extremities: No clubbing, cyanosis or edema. DP/PT/Radials 2+ and equal bilaterally.  Labs    Troponin [facility] Test)  Recent Labs  11/10/15 2311  TROPIPOC 0.00   No results for input(s): CKTOTAL, CKMB, TROPONINI in the last 72 hours. Lab Results  Component Value Date   WBC 6.9 11/10/2015   HGB 12.1 11/10/2015   HCT 36.0 11/10/2015   MCV 76.8* 11/10/2015   PLT 282 11/10/2015    Recent Labs Lab 11/10/15 2310  NA 140  K 3.6  CL 101  CO2 28  BUN 15  CREATININE 0.78  CALCIUM 10.2  GLUCOSE 118*   No results found for: CHOL, HDL, LDLCALC, TRIG No results found for: [facility]   Radiology Studies    Dg Chest Portable 1 View  11/11/2015  CLINICAL DATA:  Sharp left-sided chest pain radiating into the left arm. Onset 3 days ago. Bradycardia. EXAM: PORTABLE CHEST 1 VIEW COMPARISON:  None. FINDINGS: Prominent left heart contours and moderate cardiomegaly. The lungs are clear.  No large effusions.  No pneumothorax. IMPRESSION: Prominent left heart contours, particularly in the region of the pulmonary outflow tract. Moderate cardiomegaly. Electronically Signed   By: Ellery Plunk M.D.   On: 11/11/2015 00:26    ECG & Cardiac Imaging    ECG demonstrated SR of ~75bpm with 2:1 AV block with resultant ventricular rate of 36 and a narrow QRS.   Assessment & Plan    60 year old [place] woman with a history of HTN, palpitations, and CAD (by stress test only) who presents with 2:1 AV block. No evidence of higher grade block on telemetry and QRS complex is consistently wide. Although she is on metoprolol, she is not on a particularly high dose (and has been on it for years), and so I doubt the metoprolol is playing a major role the the bradycarda. It is more likely that this represents age related disease of the conduction system.   Admit to SDU Hold metoprolol and long  acting antihypertensives PRN IV hydralazine TSH TTE EP consult in AM NPO s/p MN for possible PPM  Signed, Glori Luis, MD 11/11/2015, 1:13 AM

## 2015-11-11 NOTE — Interval H&P Note (Signed)
History and Physical Interval Note:  11/11/2015 10:48 AM  Christine Cross  has presented today for surgery, with the diagnosis of heart block  The various methods of treatment have been discussed with the patient and family. After consideration of risks, benefits and other options for treatment, the patient has consented to  Procedure(s): Pacemaker Implant (N/A) as a surgical intervention .  The patient's history has been reviewed, patient examined, no change in status, stable for surgery.  I have reviewed the patient's chart and labs.  Questions were answered to the patient's satisfaction.     Lewayne BuntingGregg Draedyn Weidinger

## 2015-11-12 ENCOUNTER — Inpatient Hospital Stay (HOSPITAL_COMMUNITY): Payer: Self-pay

## 2015-11-12 ENCOUNTER — Encounter (HOSPITAL_COMMUNITY): Payer: Self-pay | Admitting: Cardiology

## 2015-11-12 DIAGNOSIS — I459 Conduction disorder, unspecified: Secondary | ICD-10-CM

## 2015-11-12 DIAGNOSIS — R42 Dizziness and giddiness: Secondary | ICD-10-CM

## 2015-11-12 DIAGNOSIS — Z950 Presence of cardiac pacemaker: Secondary | ICD-10-CM | POA: Diagnosis not present

## 2015-11-12 HISTORY — DX: Conduction disorder, unspecified: I45.9

## 2015-11-12 LAB — HEMOGLOBIN A1C
HEMOGLOBIN A1C: 5.9 % — AB (ref 4.8–5.6)
MEAN PLASMA GLUCOSE: 123 mg/dL

## 2015-11-12 MED ORDER — CARVEDILOL 6.25 MG PO TABS: 6.2500 mg | ORAL_TABLET | Freq: Two times a day (BID) | ORAL | Status: DC

## 2015-11-12 MED ORDER — AMLODIPINE BESYLATE 5 MG PO TABS
5.0000 mg | ORAL_TABLET | Freq: Every day | ORAL | Status: DC
  Administered 2015-11-12: 08:00:00 5 mg via ORAL
  Filled 2015-11-12: qty 1

## 2015-11-12 MED ORDER — CARVEDILOL 3.125 MG PO TABS
6.2500 mg | ORAL_TABLET | Freq: Two times a day (BID) | ORAL | Status: DC
  Administered 2015-11-12: 08:00:00 6.25 mg via ORAL
  Filled 2015-11-12: qty 2

## 2015-11-12 MED ORDER — YOU HAVE A PACEMAKER BOOK
Freq: Once | Status: AC
  Administered 2015-11-12: 07:00:00
  Filled 2015-11-12: qty 1

## 2015-11-12 MED ORDER — ACETAMINOPHEN 325 MG PO TABS: 325.0000 mg | ORAL_TABLET | ORAL | Status: AC | PRN

## 2015-11-12 MED ORDER — ISOSORBIDE DINITRATE 10 MG PO TABS: 10.0000 mg | ORAL_TABLET | Freq: Two times a day (BID) | ORAL | Status: DC

## 2015-11-12 MED ORDER — ISOSORBIDE DINITRATE 10 MG PO TABS
10.0000 mg | ORAL_TABLET | Freq: Two times a day (BID) | ORAL | Status: DC
  Administered 2015-11-12: 10 mg via ORAL
  Filled 2015-11-12 (×2): qty 1

## 2015-11-12 MED ORDER — ISOSORBIDE DINITRATE 2.5 MG SL SUBL
10.0000 mg | SUBLINGUAL_TABLET | Freq: Two times a day (BID) | SUBLINGUAL | Status: DC
  Filled 2015-11-12: qty 4

## 2015-11-12 NOTE — Discharge Summary (Signed)
Physician Discharge Summary       Patient ID: Christine Cross MRN: [id number] DOB/AGE: 61-Feb-1957 61 y.o.  Admit date: 11/10/2015 Discharge date: 11/12/2015 Primary Cardiologist:  Dr. Ladona Ridgel  Discharge Diagnoses:  Principal Problem:   AV block, 2nd degree Active Problems:   S/P cardiac pacemaker procedure-[facility]   Chest pressure   Hypertensive heart disease without heart failure   Dizziness, secondary to 2:1 block   Discharged Condition: good  Procedures: 11/11/15 placement of PPM [facility] by Dr. Ladona Ridgel.   Hospital Course:   60 year old [place] woman with a history of HTN, palpitations, and CAD (by stress test only) who presents with 2:1 AV block. Christine Cross lives in [place] and came to [place] to visit her daughter (a Teacher, early years/pre) who just had a baby. Christine Cross does not have any providers in the [place]. She will be staying for about 5 months. She states that she has had chest pain and had a stress test in [place] 3 years ago. It was positive and she was put on ASA, plavix, metoprolol, and nitrates. She did not undergo cath or revascularization. She has continued to have brief episodes of CP (up to 3 seconds in duration) about 3 times per week. Over the past month she has had increased palpitations and occasionally a slow HR. The patients daughter has apparently directed either taking an additional metoprolol or skipping a day (and taking caffeine) if her HR was too low.   She felt progressively worse over the past week, with dizziness, palpitations, pre-syncope, recurrent of her typical fleeting CP, DOE. Today, her CP radiated to her arms and back which is atypical. Her HR was palpated at 34bpm at home. Due to progressive symptoms, she presented to the ER. On arrival to the ER, she was bradycardic to 35bpm, BNP 214/87, saturating 97% on RA. BP improved somewhat without intervention. Labs were notable for K 3.6, Cr 0.78, POC TnI 0.00, WBC 6.9, hct 36. CXR demonstated cardiomegaly but  no acute process. ECG demonstrated SR of ~75bpm with 2:1 AV block with resultant ventricular rate of 36 and a narrow QRS.   Off BB symptoms continued.  And she underwent PPM [facility]. Without complications  CXR was stable without complications and pt ambulated without problems.  Her troponin was mildly elevated secondary to PPM.  She was seen by Dr. Ladona Ridgel and felt stable for discharge.  Her PPM settings were evaluated and her impedence was stable.    Consults: cardiology with EP  Significant Diagnostic Studies:  BMP Latest Ref Rng 11/11/2015 11/10/2015  Glucose 65 - 99 mg/dL 409(W) 119(J)  BUN 6 - 20 mg/dL 10 15  Creatinine 4.78 - 1.00 mg/dL 2.95 6.21  Sodium 308 - 145 mmol/L 144 140  Potassium 3.5 - 5.1 mmol/L 3.2(L) 3.6  Chloride 101 - 111 mmol/L 107 101  CO2 22 - 32 mmol/L 26 28  Calcium 8.9 - 10.3 mg/dL 9.5 65.7   CBC Latest Ref Rng 11/11/2015 11/10/2015  WBC 4.0 - 10.5 K/uL 5.6 6.9  Hemoglobin 12.0 - 15.0 g/dL 84.6 96.2  Hematocrit 95.2 - 46.0 % 37.0 36.0  Platelets 150 - 400 K/uL 247 282   Troponin 0.10  Discharge Exam: Blood pressure 172/76, pulse 78, temperature 97.6 F (36.4 C), temperature source Oral, resp. rate 23, height  (1.905 m), weight 292 lb 3.2 oz (132.541 kg), SpO2 97 %.  PE: General:Pleasant affect, NAD Skin:Warm and dry, brisk capillary refill HEENT:normocephalic, sclera clear, mucus membranes moist Heart:S1S2 RRR  without murmur, gallup, rub or click Lungs:clear without rales, rhonchi, or wheezes ZOX:WRUEAbd:soft, non tender, + BS, do not palpate liver spleen or masses Ext:no lower ext edema, 2+ pedal pulses, 2+ radial pulses Neuro:alert and oriented, MAE, follows commands, + facial symmetry Tele: Pacing    Disposition: Home    Medication List    STOP taking these medications        metoprolol succinate 50 MG 24 hr tablet  Commonly known as:  TOPROL-XL      TAKE these medications        acetaminophen 325 MG tablet  Commonly known as:   TYLENOL  Take 1-2 tablets (325-650 mg total) by mouth every 4 (four) hours as needed for mild pain.     amLODipine 5 MG tablet  Commonly known as:  NORVASC  Take 5 mg by mouth daily.     aspirin EC 81 MG tablet  Take 81 mg by mouth daily.     carvedilol 6.25 MG tablet  Commonly known as:  COREG  Take 1 tablet (6.25 mg total) by mouth 2 (two) times daily with a meal.     clopidogrel 75 MG tablet  Commonly known as:  PLAVIX  Take 75 mg by mouth daily.     ISOSORBIDE DINITRATE SL  Place 10 mg under the tongue 2 (two) times daily.       Follow-up Information    [facility] [address] [facility] On 11/17/2015.   Specialty:  Cardiology   Why:  12:00noon, wound check   Contact information:   [address], [address] [place] [id number] [phone]      Follow [facility] with [facility] [address] [facility] On 11/21/2015.   Specialty:  Cardiology   Why:  12:30PM, part one of stress test   Contact information:   [address], [address] [place] [id number] [phone]      [facility] [address] [facility] On 11/22/2015.   Specialty:  Cardiology   Why:  2:00PM, part two of stress test   Contact information:   [address], [address] [place] [id number] [phone]      Follow up with Lewayne BuntingGregg Taylor, MD On 01/17/2016.   Specialty:  Cardiology   Why:  1:45PM   Contact information:   [address] [place] [id number] [phone]        Discharge Instructions: Pacemaker instructions  Heart Healthy Diet  We changed your metoprolol to carvedilol.   Call [phone] if any problems or questions.  For stress test nothing to eat or drink after 9 am that day on 11/22/15.    Signed: Leone BrandINGOLD,LAURA R Nurse Practitioner-Certified [facility]: [facility] 11/12/2015, 8:41 AM  Time spent on discharge : 30 minutes.     EP Attending  Patient seen and examined.  Agree with the findings as noted above. She is stable for discharge. Usual followup.  Leonia ReevesGregg Taylor,M.D.

## 2015-11-12 NOTE — Discharge Instructions (Signed)
° ° °  Supplemental Discharge Instructions for  Pacemaker Patients  Activity No heavy lifting or vigorous activity with your left/right arm for 6 to 8 weeks.  Do not raise your left arm above your head for one week.  Gradually raise your affected arm as drawn below.              11/15/15                    11/16/15                    11/17/15                   11/18/15 __  NO DRIVING for  1 week   ; you may begin driving on  1/61/094/21/17   .  WOUND CARE - Keep the wound area clean and dry.  Do not get this area wet for one week. No showers for one week; you may shower on  11/18/15   . - The tape/steri-strips on your wound will fall off; do not pull them off.  No bandage is needed on the site.  DO  NOT apply any creams, oils, or ointments to the wound area. - If you notice any drainage or discharge from the wound, any swelling or bruising at the site, or you develop a fever > 101? F after you are discharged home, call the office at once.  Special Instructions - You are still able to use cellular telephones; use the ear opposite the side where you have your pacemaker.  Avoid carrying your cellular phone near your device. - When traveling through airports, show security personnel your identification card to avoid being screened in the metal detectors.  Ask the security personnel to use the hand wand. - Avoid arc welding equipment, MRI testing (magnetic resonance imaging), TENS units (transcutaneous nerve stimulators).  Call the office for questions about other devices. - Avoid electrical appliances that are in poor condition or are not properly grounded. - Microwave ovens are safe to be near or to operate.    Heart Healthy Diet  We changed your metoprolol to carvedilol.   Call [phone] if any problems or questions.  For stress test nothing to eat or drink after 9 am that day on 11/22/15.

## 2015-11-12 NOTE — Progress Notes (Signed)
Pt profile:  60 year old female from [place]  Presents with 2:1 Block and chest pressure. HR to 35. BB stopped and 2:1 Block continued. Symptomatic with episodic dizziness.  PPM placed.   [employer] (serial number [id number]) pacemaker  Subjective: doing well  Objective: Vital signs in last 24 hours: Temp:  [97.6 F (36.4 C)-98.3 F (36.8 C)] 97.6 F (36.4 C) (04/15 0439) Pulse Rate:  [0-263] 78 (04/14 2200) Resp:  [0-92] 23 (04/15 0550) BP: (137-188)/(45-134) 172/76 mmHg (04/15 0550) SpO2:  [0 %-100 %] 97 % (04/15 0439) Weight change:  Last BM Date: 11/11/15 Intake/Output from previous day: -780 04/14 0701 - 04/15 0700 In: 320 [P.O.:220; I.V.:50; IV Piggyback:50] Out: 1100 [Urine:1100] Intake/Output this shift:    PE: General:Pleasant affect, NAD Skin:Warm and dry, brisk capillary refill HEENT:normocephalic, sclera clear, mucus membranes moist Heart:S1S2 RRR without murmur, gallup, rub or click Lungs:clear without rales, rhonchi, or wheezes EAV:WUJW, non tender, + BS, do not palpate liver spleen or masses Ext:no lower ext edema, 2+ pedal pulses, 2+ radial pulses Neuro:alert and oriented, MAE, follows commands, + facial symmetry Tele:  Pacing   Lab Results:  Recent Labs  11/10/15 2310 11/11/15 0830  WBC 6.9 5.6  HGB 12.1 12.6  HCT 36.0 37.0  PLT 282 247   BMET  Recent Labs  11/10/15 2310 11/11/15 0830  NA 140 144  K 3.6 3.2*  CL 101 107  CO2 28 26  GLUCOSE 118* 125*  BUN 15 10  CREATININE 0.78 0.45  CALCIUM 10.2 9.5    Recent Labs  11/11/15 0830 11/11/15 1435  TROPONINI <0.03 0.10*    Lab Results  Component Value Date   CHOL 234* 11/11/2015   HDL 35* 11/11/2015   LDLCALC 184* 11/11/2015   TRIG 75 11/11/2015   CHOLHDL 6.7 11/11/2015   Lab Results  Component Value Date   HGBA1C 5.9* 11/11/2015     Lab Results  Component Value Date   TSH 1.424 11/11/2015    Hepatic Function Panel  Recent Labs  11/11/15 0346  PROT 6.8    ALBUMIN 3.5  AST 20  ALT 39  ALKPHOS 66  BILITOT 0.5  BILIDIR <0.1*  IBILI NOT CALCULATED    Recent Labs  11/11/15 0346  CHOL 234*   No results for input(s): PROTIME in the last 72 hours.     Studies/Results: Dg Chest Portable 1 View  11/11/2015  CLINICAL DATA:  Lambert Mody left-sided chest pain radiating into the left arm. Onset 3 days ago. Bradycardia. EXAM: PORTABLE CHEST 1 VIEW COMPARISON:  None. FINDINGS: Prominent left heart contours and moderate cardiomegaly. The lungs are clear.  No large effusions.  No pneumothorax. IMPRESSION: Prominent left heart contours, particularly in the region of the pulmonary outflow tract. Moderate cardiomegaly. Electronically Signed   By: Ellery Plunk M.D.   On: 11/11/2015 00:26    Medications: I have reviewed the patient's current medications. Scheduled Meds: . aspirin EC  81 mg Oral Daily  . clopidogrel  75 mg Oral Daily   Continuous Infusions:  PRN Meds:.acetaminophen, hydrALAZINE, ondansetron (ZOFRAN) IV   Assessment/Plan: Principal Problem:   AV block, 2nd degree Active Problems:   S/P cardiac pacemaker procedure-[employer]   Chest pressure   Hypertensive heart disease without heart failure   Dizziness, secondary to 2:1 block  S/p PPM [employer] - CXR Pending HTN- resume BB? And amlodipine  outpt nuc study. Follow up in device clinic in 7-10 days Follow up  with Dr. Ladona Ridgelaylor in  3 months - to check when return to [place]. Pacemaker interrogated.    LOS: 1 day   Time spent with pt. :15 minutes. [facility] PointeNGOLD,LAURA R  Nurse Practitioner Certified Pager [phone] or after 5pm and on weekends call [phone] 11/12/2015, 8:01 AM   EP Attending  Patient seen and examined. Agree with above. See my discharge summary. She is stable for discharge. Her blood pressure has been a little high and we have adjusted her meds so that she will go home on coreg instead of toprol and plan to uptitrate as an outpatient.  Leonia ReevesGregg Alwilda Gilland,M.D.

## 2015-11-14 MED FILL — Gentamicin Sulfate Inj 40 MG/ML: INTRAMUSCULAR | Qty: 2 | Status: AC

## 2015-11-14 MED FILL — Sodium Chloride Irrigation Soln 0.9%: Qty: 1000 | Status: AC

## 2015-11-16 ENCOUNTER — Telehealth (HOSPITAL_COMMUNITY): Payer: Self-pay | Admitting: *Deleted

## 2015-11-16 NOTE — Telephone Encounter (Signed)
Patient given detailed instructions per Myocardial Perfusion Study Information Sheet for the test on 11/21/15. Patient notified to arrive 15 minutes early and that it is imperative to arrive on time for appointment to keep from having the test rescheduled.  If you need to cancel or reschedule your appointment, please call the office within 24 hours of your appointment. Failure to do so may result in a cancellation of your appointment, and a $50 no show fee. Patient verbalized understanding.Arush Gatliff J Mechel Schutter, RN   

## 2015-11-16 NOTE — Progress Notes (Signed)
This encounter was created in error - please disregard.

## 2015-11-17 ENCOUNTER — Ambulatory Visit (INDEPENDENT_AMBULATORY_CARE_PROVIDER_SITE_OTHER): Payer: Self-pay | Admitting: *Deleted

## 2015-11-17 ENCOUNTER — Encounter: Payer: Self-pay | Admitting: Cardiology

## 2015-11-17 ENCOUNTER — Encounter: Payer: Self-pay | Admitting: Internal Medicine

## 2015-11-17 DIAGNOSIS — I441 Atrioventricular block, second degree: Secondary | ICD-10-CM

## 2015-11-18 LAB — CUP PACEART INCLINIC DEVICE CHECK
Date Time Interrogation Session: 20170420040000
Implantable Lead Implant Date: 20170414
Implantable Lead Location: 753860
Implantable Lead Model: 7740
Implantable Lead Model: 7741
Implantable Lead Serial Number: 662731
Lead Channel Impedance Value: 675 Ohm
Lead Channel Impedance Value: 758 Ohm
Lead Channel Pacing Threshold Amplitude: 0.8 V
Lead Channel Pacing Threshold Pulse Width: 0.4 ms
Lead Channel Sensing Intrinsic Amplitude: 25 mV
Lead Channel Sensing Intrinsic Amplitude: 8.7 mV
Lead Channel Setting Pacing Amplitude: 3.5 V
Lead Channel Setting Sensing Sensitivity: 2.5 mV
MDC IDC LEAD IMPLANT DT: 20170414
MDC IDC LEAD LOCATION: 753859
MDC IDC LEAD SERIAL: 754519
MDC IDC MSMT LEADCHNL RA PACING THRESHOLD AMPLITUDE: 0.8 V
MDC IDC MSMT LEADCHNL RV PACING THRESHOLD PULSEWIDTH: 0.4 ms
MDC IDC SET LEADCHNL RV PACING AMPLITUDE: 1.4 V
MDC IDC SET LEADCHNL RV PACING PULSEWIDTH: 0.4 ms
Pulse Gen Serial Number: 743775

## 2015-11-18 NOTE — Progress Notes (Signed)
Wound check appointment. Steri-strips removed. Wound without redness or edema. Incision edges approximated, wound well healed. Normal device function. Thresholds, sensing, and impedances consistent with implant measurements. Device programmed at 3.5V in RA for extra safety margin until 3 month visit, RV auto cap programmed on at implant. Histogram distribution appropriate for patient and level of activity. No mode switches or high ventricular rates noted. "PMT" episodes recorded--no true PMT. Patient educated about wound care, arm mobility, lifting restrictions, and remote monitoring. ROV in 3 months with GT.

## 2015-11-21 ENCOUNTER — Ambulatory Visit (HOSPITAL_COMMUNITY): Payer: Self-pay | Attending: Cardiovascular Disease

## 2015-11-21 DIAGNOSIS — R079 Chest pain, unspecified: Secondary | ICD-10-CM | POA: Insufficient documentation

## 2015-11-21 DIAGNOSIS — R9439 Abnormal result of other cardiovascular function study: Secondary | ICD-10-CM | POA: Insufficient documentation

## 2015-11-21 DIAGNOSIS — I119 Hypertensive heart disease without heart failure: Secondary | ICD-10-CM | POA: Insufficient documentation

## 2015-11-21 MED ORDER — REGADENOSON 0.4 MG/5ML IV SOLN
0.4000 mg | Freq: Once | INTRAVENOUS | Status: AC
  Administered 2015-11-21: 0.4 mg via INTRAVENOUS

## 2015-11-21 MED ORDER — TECHNETIUM TC 99M SESTAMIBI GENERIC - CARDIOLITE
32.8000 | Freq: Once | INTRAVENOUS | Status: AC | PRN
  Administered 2015-11-21: 32.8 via INTRAVENOUS

## 2015-11-22 ENCOUNTER — Ambulatory Visit (HOSPITAL_COMMUNITY): Payer: Self-pay | Attending: Cardiology

## 2015-11-22 LAB — MYOCARDIAL PERFUSION IMAGING
CHL CUP NUCLEAR SSS: 14
CHL CUP RESTING HR STRESS: 66 {beats}/min
CSEPPHR: 78 {beats}/min
LV dias vol: 168 mL (ref 46–106)
LVSYSVOL: 80 mL
NUC STRESS TID: 1.07
RATE: 0.24
SDS: 10
SRS: 4

## 2015-11-22 MED ORDER — TECHNETIUM TC 99M SESTAMIBI GENERIC - CARDIOLITE
31.4000 | Freq: Once | INTRAVENOUS | Status: AC | PRN
  Administered 2015-11-22: 31 via INTRAVENOUS

## 2015-11-24 ENCOUNTER — Telehealth: Payer: Self-pay | Admitting: *Deleted

## 2015-11-24 NOTE — Telephone Encounter (Signed)
-----   Message from [facility] Center[facility]ene[facility] Lynn Ursuy, [place]-C sent at 11/23/2015  1:32 PM EDT ----- Please remind the patient about her visit with Tereso NewcomerScott Weaver on the 28th to discuss her stress test result and f/u on her BP, post-hospital visit.  Thanks, [employer] Corporationenee

## 2015-11-24 NOTE — Progress Notes (Signed)
Cardiology Office Note:    Date:  11/25/2015   ID:  Christine KehrJoyce Cross, DOB 1956/02/15, MRN [id number]  PCP:  No PCP Per Patient  Cardiologist:  Dr. Lewayne BuntingGregg Taylor    Electrophysiologist:  Dr. Lewayne BuntingGregg Taylor   Referring MD: No ref. provider found   Chief Complaint  Patient presents with  . Hospitalization Follow-up    2:1 AVB >> s/p PPM; HTN; Chest pain    History of Present Illness:     Christine Cross is a 60 y.o. [place] female with a hx of HTN, Palpitations, presumed CAD by stress test.  She was visiting her [place] in [place] ([place] 5 mos).  Hx indicates she had an abnormal stress test 3 years ago and was put on ASA, beta-blocker, Plavix and Nitrates.  She did not have a cath.  She has noted brief (x seconds) chest pain a few times each week since.   Admitted 4/13-4/15 with symptomatic 2:1 and 3:1 AVB.  This did not resolved with DC of beta-blocker and she was seen by Dr. Lewayne BuntingGregg Taylor.  PPM was recommended.  Echo demonstrated normal LV function with moderate mitral regurgitation. She underwent implant of dual chamber PPM on 11/11/15.  OP nuclear stress test was recommended to evaluate CP.  This demonstrated inferior and apical defect consistent with prior infarct and mild peri-infarct ischemia; EF 52% with apical hypokinesis.    Returns for FU.  Here with her daughter.  She is doing well.  BP at home 150-160s.  She has some atypical sharp chest pains that are improved since her PPM. She denies dyspnea.  She has fatigue and decreased exercise tol.  But this is much better since her PPM.  She denies orthopnea, PND, edema.     Past Medical History  Diagnosis Date  . Mitral regurgitation   . Hypertension   . CAD (coronary artery disease)     a. presumed CAD - Myoview 4/17 - Low risk stress nuclear study with a large, severe, partially reversible inferior and apical defect consistent with prior infarct and mild peri-infarct ischemia; EF 52 with apical hypokinesis.>> no  angina - [facility]  . Heart block 11/12/2015    [employer] [facility] PPM placed  . HLD (hyperlipidemia)   . History of echocardiogram     a. Echo 4/17 - Moderate LVH, EF 60-65%, normal wall motion, grade 1 diastolic dysfunction, moderate MR, mild LAE, moderate RAE, mild TR, PASP 30 mmHg    Past Surgical History  Procedure Laterality Date  . Ep implantable device N/A 11/11/2015    Procedure: Pacemaker Implant;  Surgeon: Marinus MawGregg W Taylor, MD;  [facility];  Service: Cardiovascular;  Laterality: N/A;    Current Medications: Current Meds  Medication Sig  . acetaminophen (TYLENOL) 325 MG tablet Take 1-2 tablets (325-650 mg total) by mouth every 4 (four) hours as needed for mild pain.  . Amlodipine-Valsartan-HCTZ (EXFORGE HCT) 5-160-12.5 MG TABS Take 2 tablets by mouth daily.  Marland Kitchen. aspirin EC 81 MG tablet Take 81 mg by mouth daily.  . carvedilol (COREG) 12.5 MG tablet Take 1 tablet (12.5 mg total) by mouth 2 (two) times daily with a meal.  . clopidogrel (PLAVIX) 75 MG tablet Take 1 tablet (75 mg total) by mouth daily.  . isosorbide dinitrate (ISORDIL) 2.5 MG SL tablet Place 1 tablet (2.5 mg total) under the tongue every 5 (five) minutes x 3 doses as needed.  . [DISCONTINUED] amLODipine (NORVASC) 5 MG tablet Take 5  mg by mouth daily.  . [DISCONTINUED] Amlodipine-Valsartan-HCTZ (EXFORGE HCT) 5-160-12.5 MG TABS Take 2 tablets by mouth daily.  . [DISCONTINUED] carvedilol (COREG) 6.25 MG tablet Take 1 tablet (6.25 mg total) by mouth 2 (two) times daily with a meal.  . [DISCONTINUED] clopidogrel (PLAVIX) 75 MG tablet Take 75 mg by mouth daily.  . [DISCONTINUED] ISOSORBIDE DINITRATE SL Place 10 mg under the tongue 2 (two) times daily.  . [DISCONTINUED] valsartan-hydrochlorothiazide (DIOVAN-HCT) 160-12.5 MG tablet Take 1 tablet by mouth daily.     Allergies:   Review of patient's allergies indicates no known allergies.   Social History   Social History  . Marital Status: Married    Spouse Name:  N/A  . Number of Children: N/A  . Years of Education: N/A   Social History Main Topics  . Smoking status: Never Smoker   . Smokeless tobacco: None  . Alcohol Use: No  . Drug Use: None  . Sexual Activity: Not Asked   Other Topics Concern  . None   Social History Narrative     Family History:  The patient's family history is not on file.   ROS:   Please see the history of present illness.    Review of Systems  Constitution: Positive for malaise/fatigue and weight loss.  HENT: Positive for headaches.   Cardiovascular: Positive for chest pain and irregular heartbeat.  Respiratory: Positive for shortness of breath.    All other systems reviewed and are negative.   Physical Exam:    VS:  BP 138/90 mmHg  Pulse 64  Ht  (1.702 m)  Wt 288 lb (130.636 kg)  BMI 45.10 kg/m2   GEN: Well nourished, well developed, in no acute distress HEENT: normal Neck: no JVD, no masses Cardiac: Normal S1/S2, RRR; no murmurs, rubs, or gallops, no edema;     Respiratory:  clear to auscultation bilaterally; no wheezing, rhonchi or rales GI: soft, nontender, nondistended MS: no deformity or atrophy Skin: warm and dry Neuro: No focal deficits  Psych: Alert and oriented x 3, normal affect  Wt Readings from Last 3 Encounters:  11/25/15 288 lb (130.636 kg)  11/11/15 292 lb 3.2 oz (132.541 kg)      Studies/Labs Reviewed:     EKG:  EKG is not ordered today.  The ekg ordered today demonstrates n/a  Recent Labs: 11/11/2015: ALT 39; BUN 10; Creatinine, Ser 0.45; Hemoglobin 12.6; Magnesium 1.8; Platelets 247; Potassium 3.2*; Sodium 144; TSH 1.424   Recent Lipid Panel    Component Value Date/Time   CHOL 234* 11/11/2015 0346   TRIG 75 11/11/2015 0346   HDL 35* 11/11/2015 0346   CHOLHDL 6.7 11/11/2015 0346   VLDL 15 11/11/2015 0346   LDLCALC 184* 11/11/2015 0346    Additional studies/ records that were reviewed today include:   Myoview 11/22/15 Low risk stress nuclear study with a  large, severe, partially reversible inferior and apical defect consistent with prior infarct and mild peri-infarct ischemia; EF 52 with apical hypokinesis.  Echo 11/11/15 Moderate LVH, EF 60-65%, normal wall motion, grade 1 diastolic dysfunction, moderate MR, mild LAE, moderate RAE, mild TR, PASP 30 mmHg   ASSESSMENT:     1. Coronary artery disease involving native coronary artery of native heart without angina pectoris   2. Hypertensive heart disease without heart failure   3. Hyperlipidemia   4. Cardiac pacemaker in situ     PLAN:     In order of problems listed above:  1. CAD - She  has presumed CAD based upon abnormal Myoview. I reviewed her case today with Dr. Tenny Craw (DOD). Patient has atypical chest pain. However, she does not seem to have significant symptoms of angina. Exercise tolerance is improved s/p PPM.  Her ejection fraction is normal. Therefore, medical therapy will be pursued. Continue aspirin, Plavix, beta blocker, ARB. She takes nitroglycerin as needed. She is not on statin therapy for unclear reasons. She previously took Crestor. I will place her on Crestor 20 mg daily. She will need follow-up lipids and LFTs in 6 weeks.  2. HTN - Blood pressure better controlled. Still not at target. Increase carvedilol to 12.5 mg twice a day. Continue Exforge-HCT.  3. HL - Start Crestor 20 mg daily. Arrange follow-up lipids and LFTs in 6 weeks.  4. PPM -  FU with EP as planned.    Medication Adjustments/Labs and Tests Ordered: Current medicines are reviewed at length with the patient today.  Concerns regarding medicines are outlined above.  Medication changes, Labs and Tests ordered today are outlined in the Patient Instructions noted below. Patient Instructions  Medication Instructions:  Your physician has recommended you make the following change in your medication:  1. Increase  Coreg ( 12.5 mg ) twice a day, you can use up two of your old tablets twice a day until gone 2. Start  Crestor ( 20 mg ) daily 3. All prescriptions were sent in to [facility] on [address] with a 90 day supply Labwork:  Your physician recommends that you have lab work today: BMET Your physician recommends that you return for a FASTING lipid profile:LFT on the day of appointment with Dr. Ladona Ridgel Testing/Procedures: -None Follow-Up: Your physician recommends that you keep your schedule a follow-up appointment with Dr. Ladona Ridgel in June Any Other Special Instructions Will Be Listed Below (If Applicable). 1. Check Blood Pressure when you get home if not the same as here get an arm cuff and keep track of bp.  If bp is greater than 140 call our office at [phone] If you need a refill on your cardiac medications before your next appointment, please call your pharmacy.     Signed, Tereso Newcomer, PA-C  11/25/2015 2:00 PM    [facility] [address] [place], [place], [place]  [address] Phone: [phone]; Fax: [phone]

## 2015-11-25 ENCOUNTER — Encounter: Payer: Self-pay | Admitting: Physician Assistant

## 2015-11-25 ENCOUNTER — Ambulatory Visit (INDEPENDENT_AMBULATORY_CARE_PROVIDER_SITE_OTHER): Payer: Self-pay | Admitting: Physician Assistant

## 2015-11-25 VITALS — BP 138/90 | HR 64 | Ht 67.0 in | Wt 288.0 lb

## 2015-11-25 DIAGNOSIS — Z950 Presence of cardiac pacemaker: Secondary | ICD-10-CM

## 2015-11-25 DIAGNOSIS — I119 Hypertensive heart disease without heart failure: Secondary | ICD-10-CM

## 2015-11-25 DIAGNOSIS — E785 Hyperlipidemia, unspecified: Secondary | ICD-10-CM

## 2015-11-25 DIAGNOSIS — I2510 Atherosclerotic heart disease of native coronary artery without angina pectoris: Secondary | ICD-10-CM

## 2015-11-25 LAB — BASIC METABOLIC PANEL
BUN: 14 mg/dL (ref 7–25)
CALCIUM: 9.6 mg/dL (ref 8.6–10.4)
CO2: 28 mmol/L (ref 20–31)
Chloride: 102 mmol/L (ref 98–110)
Creat: 0.49 mg/dL — ABNORMAL LOW (ref 0.50–0.99)
GLUCOSE: 87 mg/dL (ref 65–99)
POTASSIUM: 3.4 mmol/L — AB (ref 3.5–5.3)
SODIUM: 139 mmol/L (ref 135–146)

## 2015-11-25 MED ORDER — ISOSORBIDE DINITRATE 2.5 MG SL SUBL: 2.5000 mg | SUBLINGUAL_TABLET | SUBLINGUAL | Status: AC | PRN

## 2015-11-25 MED ORDER — CLOPIDOGREL BISULFATE 75 MG PO TABS: 75.0000 mg | ORAL_TABLET | Freq: Every day | ORAL | Status: DC

## 2015-11-25 MED ORDER — CARVEDILOL 12.5 MG PO TABS: 12.5000 mg | ORAL_TABLET | Freq: Two times a day (BID) | ORAL | Status: DC

## 2015-11-25 MED ORDER — AMLODIPINE-VALSARTAN-HCTZ 5-160-12.5 MG PO TABS: 2.0000 | ORAL_TABLET | Freq: Every day | ORAL | Status: DC

## 2015-11-25 MED ORDER — ROSUVASTATIN CALCIUM 20 MG PO TABS: 20.0000 mg | ORAL_TABLET | Freq: Every day | ORAL | Status: DC

## 2015-11-25 NOTE — Patient Instructions (Addendum)
Medication Instructions:  Your physician has recommended you make the following change in your medication:  1. Increase  Coreg ( 12.5 mg ) twice a day, you can use up two of your old tablets twice a day until gone 2. Start Crestor ( 20 mg ) daily 3. [facility] on [address] with a 90 [address] supply Labwork:  Your physician recommends that you have lab work today: BMET Your physician recommends that you return for a FASTING lipid profile:LFT on the day of appointment with Dr. Ladona Ridgelaylor Testing/Procedures: -None Follow-Up: Your physician recommends that you keep your schedule a follow-up appointment with Dr. Ladona Ridgelaylor in June Any Other Special Instructions Will Be Listed Below (If Applicable). 1. Check Blood Pressure when you get home if not the same as here get an arm cuff and keep track of bp.  If bp is greater than 140 call our office at [phone] If you need a refill on your cardiac medications before your next appointment, please call your pharmacy.

## 2015-11-29 ENCOUNTER — Telehealth: Payer: Self-pay | Admitting: *Deleted

## 2015-11-29 DIAGNOSIS — I119 Hypertensive heart disease without heart failure: Secondary | ICD-10-CM

## 2015-11-29 MED ORDER — POTASSIUM CHLORIDE CRYS ER 20 MEQ PO TBCR
20.0000 meq | EXTENDED_RELEASE_TABLET | Freq: Every day | ORAL | Status: DC
Start: 2015-11-29 — End: 2017-01-04

## 2015-11-29 NOTE — Telephone Encounter (Signed)
Pt notified of lab results and recommendations. Pt agreeable to start K+ 20 meq daily; bmet 5/10. [facility] [place].

## 2015-12-07 ENCOUNTER — Other Ambulatory Visit: Payer: Self-pay | Admitting: *Deleted

## 2015-12-07 DIAGNOSIS — I2510 Atherosclerotic heart disease of native coronary artery without angina pectoris: Secondary | ICD-10-CM

## 2015-12-07 DIAGNOSIS — I119 Hypertensive heart disease without heart failure: Secondary | ICD-10-CM

## 2015-12-07 DIAGNOSIS — E785 Hyperlipidemia, unspecified: Secondary | ICD-10-CM

## 2015-12-07 DIAGNOSIS — Z950 Presence of cardiac pacemaker: Secondary | ICD-10-CM

## 2015-12-07 LAB — BASIC METABOLIC PANEL
BUN: 17 mg/dL (ref 7–25)
CALCIUM: 9.4 mg/dL (ref 8.6–10.4)
CO2: 27 mmol/L (ref 20–31)
Chloride: 103 mmol/L (ref 98–110)
Creat: 0.57 mg/dL (ref 0.50–0.99)
Glucose, Bld: 102 mg/dL — ABNORMAL HIGH (ref 65–99)
Potassium: 3.9 mmol/L (ref 3.5–5.3)
SODIUM: 141 mmol/L (ref 135–146)

## 2015-12-09 ENCOUNTER — Telehealth: Payer: Self-pay | Admitting: *Deleted

## 2015-12-09 NOTE — Telephone Encounter (Signed)
Ptcb and has been notified of her lab results by phone with verbal understanding.

## 2015-12-09 NOTE — Telephone Encounter (Signed)
Lmtcb to go over lab results 

## 2016-01-17 ENCOUNTER — Other Ambulatory Visit: Payer: Self-pay

## 2016-01-17 ENCOUNTER — Ambulatory Visit (INDEPENDENT_AMBULATORY_CARE_PROVIDER_SITE_OTHER): Payer: Self-pay | Admitting: Internal Medicine

## 2016-01-17 ENCOUNTER — Encounter: Payer: Self-pay | Admitting: Internal Medicine

## 2016-01-17 VITALS — BP 136/90 | HR 65 | Ht 66.0 in | Wt 285.6 lb

## 2016-01-17 DIAGNOSIS — I441 Atrioventricular block, second degree: Secondary | ICD-10-CM

## 2016-01-17 MED ORDER — CARVEDILOL 6.25 MG PO TABS
6.2500 mg | ORAL_TABLET | Freq: Two times a day (BID) | ORAL | Status: DC
Start: 2016-01-17 — End: 2017-04-29

## 2016-01-17 NOTE — Progress Notes (Signed)
HPI Christine Cross returns today for followup. She is a pleasant middle aged obese african woman with symptomatic sinus node dysfunction, obesity and HTN. She underwent PPM insertion about 3 months ago. She has been stable in the interim. She denies chest pain or sob. No syncope. She is anxious about returning to [place].  Allergies  Allergen Reactions  . Other Anaphylaxis    SEAFOOD     Current Outpatient Prescriptions  Medication Sig Dispense Refill  . acetaminophen (TYLENOL) 325 MG tablet Take 1-2 tablets (325-650 mg total) by mouth every 4 (four) hours as needed for mild pain.    . Amlodipine-Valsartan-HCTZ (EXFORGE HCT) 5-160-12.5 MG TABS Take 2 tablets by mouth daily. 180 tablet 3  . aspirin EC 81 MG tablet Take 81 mg by mouth daily.    . carvedilol (COREG) 6.25 MG tablet Take 1 tablet by mouth 2 (two) times daily.  6  . clopidogrel (PLAVIX) 75 MG tablet Take 1 tablet (75 mg total) by mouth daily. 90 tablet 3  . isosorbide dinitrate (ISORDIL) 2.5 MG SL tablet Place 1 tablet (2.5 mg total) under the tongue every 5 (five) minutes x 3 doses as needed. 100 tablet 3  . potassium chloride SA (K-DUR,KLOR-CON) 20 MEQ tablet Take 1 tablet (20 mEq total) by mouth daily. 30 tablet 11  . rosuvastatin (CRESTOR) 20 MG tablet Take 1 tablet (20 mg total) by mouth daily. 90 tablet 3   No current facility-administered medications for this visit.     Past Medical History  Diagnosis Date  . Mitral regurgitation   . Hypertension   . CAD (coronary artery disease)     a. presumed CAD - Myoview 4/17 - Low risk stress nuclear study with a large, severe, partially reversible inferior and apical defect consistent with prior infarct and mild peri-infarct ischemia; EF 52 with apical hypokinesis.>> no angina - [facility]  . Heart block 11/12/2015    [employer] [facility] PPM placed  . HLD (hyperlipidemia)   . History of echocardiogram     a. Echo 4/17 - Moderate LVH, EF 60-65%, normal wall motion, grade 1 diastolic  dysfunction, moderate MR, mild LAE, moderate RAE, mild TR, PASP 30 mmHg    ROS:   All systems reviewed and negative except as noted in the HPI.   Past Surgical History  Procedure Laterality Date  . Ep implantable device N/A 11/11/2015    Procedure: Pacemaker Implant;  Surgeon: Marinus MawGregg W Taylor, MD;  [facility];  Service: Cardiovascular;  Laterality: N/A;     No family history on file.   Social History   Social History  . Marital Status: Married    Spouse Name: N/A  . Number of Children: N/A  . Years of Education: N/A   Occupational History  . Not on file.   Social History Main Topics  . Smoking status: Never Smoker   . Smokeless tobacco: Not on file  . Alcohol Use: No  . Drug Use: Not on file  . Sexual Activity: Not on file   Other Topics Concern  . Not on file   Social History Narrative     BP 136/90 mmHg  Pulse 65  Ht 5\' 6"  (1.676 m)  Wt 285 lb 9.6 oz (129.547 kg)  BMI 46.12 kg/m2  Physical Exam:  Well appearing obese middle aged woman, NAD HEENT: Unremarkable Neck:  No JVD, no thyromegally Lymphatics:  No adenopathy Back:  No CVA tenderness Lungs:  Clear with no  wheezes HEART:  Regular rate rhythm, no murmurs, no rubs, no clicks Abd:  soft, positive bowel sounds, no organomegally, no rebound, no guarding Ext:  2 plus pulses, no edema, no cyanosis, no clubbing Skin:  No rashes no nodules Neuro:  CN II through XII intact, motor grossly intact   DEVICE  Normal device function.  See PaceArt for details.   Assess/Plan: 1. Sinus node dysfunction - she is asymptomatic s/p PPM insertion.  2. PPM - her [place] DDD PM is working normally. Will recheck in several months 3. Obesity - i have asked the patient to lose weight. 4. HTN -  Her blood pressure is a bit elevated. She is encouraged to reduce her salt intake and to lose weight.   Leonia Reeves.D.

## 2016-01-17 NOTE — Patient Instructions (Addendum)
Medication Instructions:  Your physician recommends that you continue on your current medications as directed. Please refer to the Current Medication list given to you today.   Labwork: None ordered   Testing/Procedures: None ordered   Follow-Up: Your physician wants you to follow-up in: 12 month follow up with Dr Court Joyaylor You will receive a reminder letter in the mail two months in advance. If you don't receive a letter, please call our office to schedule the follow-up appointment.  Remote monitoring is used to monitor your Pacemaker  from home. This monitoring reduces the number of office visits required to check your device to one time per year. It allows us to keep an eye on the functioning of your device to ensure it is working properly. You are scheduled for a device check from home on 04/17/16. You may send your transmission at any time that day. If you have a wireless device, the transmission will be sent automatically. After your physician reviews your transmission, you will receive a postcard with your next transmission date.    Any Other Special Instructions Will Be Listed Below (If Applicable).     If you need a refill on your cardiac medications before your next appointment, please call your pharmacy.

## 2016-01-18 LAB — CUP PACEART INCLINIC DEVICE CHECK
Implantable Lead Implant Date: 20170414
Implantable Lead Location: 753859
Implantable Lead Model: 7741
Implantable Lead Serial Number: 754519
Lead Channel Impedance Value: 610 Ohm
Lead Channel Impedance Value: 734 Ohm
Lead Channel Pacing Threshold Amplitude: 0.8 V
Lead Channel Pacing Threshold Amplitude: 0.8 V
Lead Channel Pacing Threshold Pulse Width: 0.4 ms
Lead Channel Sensing Intrinsic Amplitude: 7.2 mV
Lead Channel Setting Pacing Pulse Width: 0.4 ms
Lead Channel Setting Sensing Sensitivity: 2.5 mV
MDC IDC LEAD IMPLANT DT: 20170414
MDC IDC LEAD LOCATION: 753860
MDC IDC LEAD SERIAL: 662731
MDC IDC MSMT BATTERY REMAINING LONGEVITY: 168 mo
MDC IDC MSMT LEADCHNL RV PACING THRESHOLD PULSEWIDTH: 0.4 ms
MDC IDC SESS DTM: 20170621103634
MDC IDC SET LEADCHNL RA PACING AMPLITUDE: 3.5 V
MDC IDC SET LEADCHNL RV PACING AMPLITUDE: 1.4 V
MDC IDC STAT BRADY RA PERCENT PACED: 3 %
MDC IDC STAT BRADY RV PERCENT PACED: 99 %
Pulse Gen Serial Number: 743775

## 2016-02-09 DIAGNOSIS — R079 Chest pain, unspecified: Secondary | ICD-10-CM

## 2016-02-13 ENCOUNTER — Encounter: Payer: Self-pay | Admitting: Internal Medicine

## 2016-04-17 ENCOUNTER — Telehealth: Payer: Self-pay | Admitting: Cardiology

## 2016-04-17 ENCOUNTER — Ambulatory Visit (INDEPENDENT_AMBULATORY_CARE_PROVIDER_SITE_OTHER): Payer: Self-pay | Admitting: *Deleted

## 2016-04-17 DIAGNOSIS — I441 Atrioventricular block, second degree: Secondary | ICD-10-CM

## 2016-04-17 LAB — CUP PACEART REMOTE DEVICE CHECK
Battery Remaining Longevity: 162 mo
Brady Statistic RA Percent Paced: 0 %
Date Time Interrogation Session: 20170920122200
Implantable Lead Implant Date: 20170414
Implantable Lead Implant Date: 20170414
Implantable Lead Location: 753859
Implantable Lead Model: 7740
Implantable Lead Serial Number: 662731
Lead Channel Impedance Value: 702 Ohm
Lead Channel Setting Pacing Amplitude: 1.4 V
Lead Channel Setting Pacing Amplitude: 2 V
Lead Channel Setting Pacing Pulse Width: 0.4 ms
MDC IDC LEAD LOCATION: 753860
MDC IDC LEAD SERIAL: 754519
MDC IDC MSMT BATTERY REMAINING PERCENTAGE: 100 %
MDC IDC MSMT LEADCHNL RA IMPEDANCE VALUE: 602 Ohm
MDC IDC MSMT LEADCHNL RA PACING THRESHOLD AMPLITUDE: 0.5 V
MDC IDC MSMT LEADCHNL RA PACING THRESHOLD PULSEWIDTH: 0.4 ms
MDC IDC MSMT LEADCHNL RV PACING THRESHOLD AMPLITUDE: 0.8 V
MDC IDC MSMT LEADCHNL RV PACING THRESHOLD PULSEWIDTH: 0.4 ms
MDC IDC PG SERIAL: 743775
MDC IDC SET LEADCHNL RV SENSING SENSITIVITY: 2.5 mV
MDC IDC STAT BRADY RV PERCENT PACED: 100 %

## 2016-04-17 NOTE — Telephone Encounter (Signed)
Spoke with pt and reminded pt of remote transmission that is due today. Pt verbalized understanding.   

## 2016-04-18 ENCOUNTER — Encounter: Payer: Self-pay | Admitting: Cardiology

## 2016-04-18 NOTE — Progress Notes (Signed)
Remote pacemaker transmission.   

## 2016-07-17 ENCOUNTER — Telehealth: Payer: Self-pay | Admitting: Cardiology

## 2016-07-17 ENCOUNTER — Ambulatory Visit (INDEPENDENT_AMBULATORY_CARE_PROVIDER_SITE_OTHER): Payer: Self-pay | Admitting: *Deleted

## 2016-07-17 DIAGNOSIS — I441 Atrioventricular block, second degree: Secondary | ICD-10-CM

## 2016-07-17 NOTE — Telephone Encounter (Signed)
LMOVM reminding pt to send remote transmission.   

## 2016-07-20 NOTE — Progress Notes (Signed)
Remote pacemaker transmission.   

## 2016-07-25 LAB — CUP PACEART REMOTE DEVICE CHECK
Battery Remaining Longevity: 162 mo
Brady Statistic RV Percent Paced: 100 %
Implantable Lead Implant Date: 20170414
Implantable Lead Location: 753860
Implantable Lead Model: 7740
Implantable Lead Serial Number: 662731
Implantable Pulse Generator Implant Date: 20170414
Lead Channel Impedance Value: 632 Ohm
Lead Channel Pacing Threshold Amplitude: 0.6 V
Lead Channel Pacing Threshold Amplitude: 1 V
Lead Channel Pacing Threshold Pulse Width: 0.4 ms
Lead Channel Pacing Threshold Pulse Width: 0.4 ms
Lead Channel Setting Pacing Amplitude: 2 V
Lead Channel Setting Sensing Sensitivity: 2.5 mV
MDC IDC LEAD IMPLANT DT: 20170414
MDC IDC LEAD LOCATION: 753859
MDC IDC LEAD SERIAL: 754519
MDC IDC MSMT BATTERY REMAINING PERCENTAGE: 100 %
MDC IDC MSMT LEADCHNL RA IMPEDANCE VALUE: 624 Ohm
MDC IDC SESS DTM: 20171220175500
MDC IDC SET LEADCHNL RV PACING AMPLITUDE: 1.5 V
MDC IDC SET LEADCHNL RV PACING PULSEWIDTH: 0.4 ms
MDC IDC STAT BRADY RA PERCENT PACED: 0 %
Pulse Gen Serial Number: 743775

## 2016-12-16 IMAGING — CR DG CHEST 1V PORT
1 series · 1 of 1 positions shown · non-contrast
Comparison: None.

CLINICAL DATA: Sharp left-sided chest pain radiating into the left
arm. Onset 3 days ago. Bradycardia.

EXAM:
PORTABLE CHEST 1 VIEW

[AP]
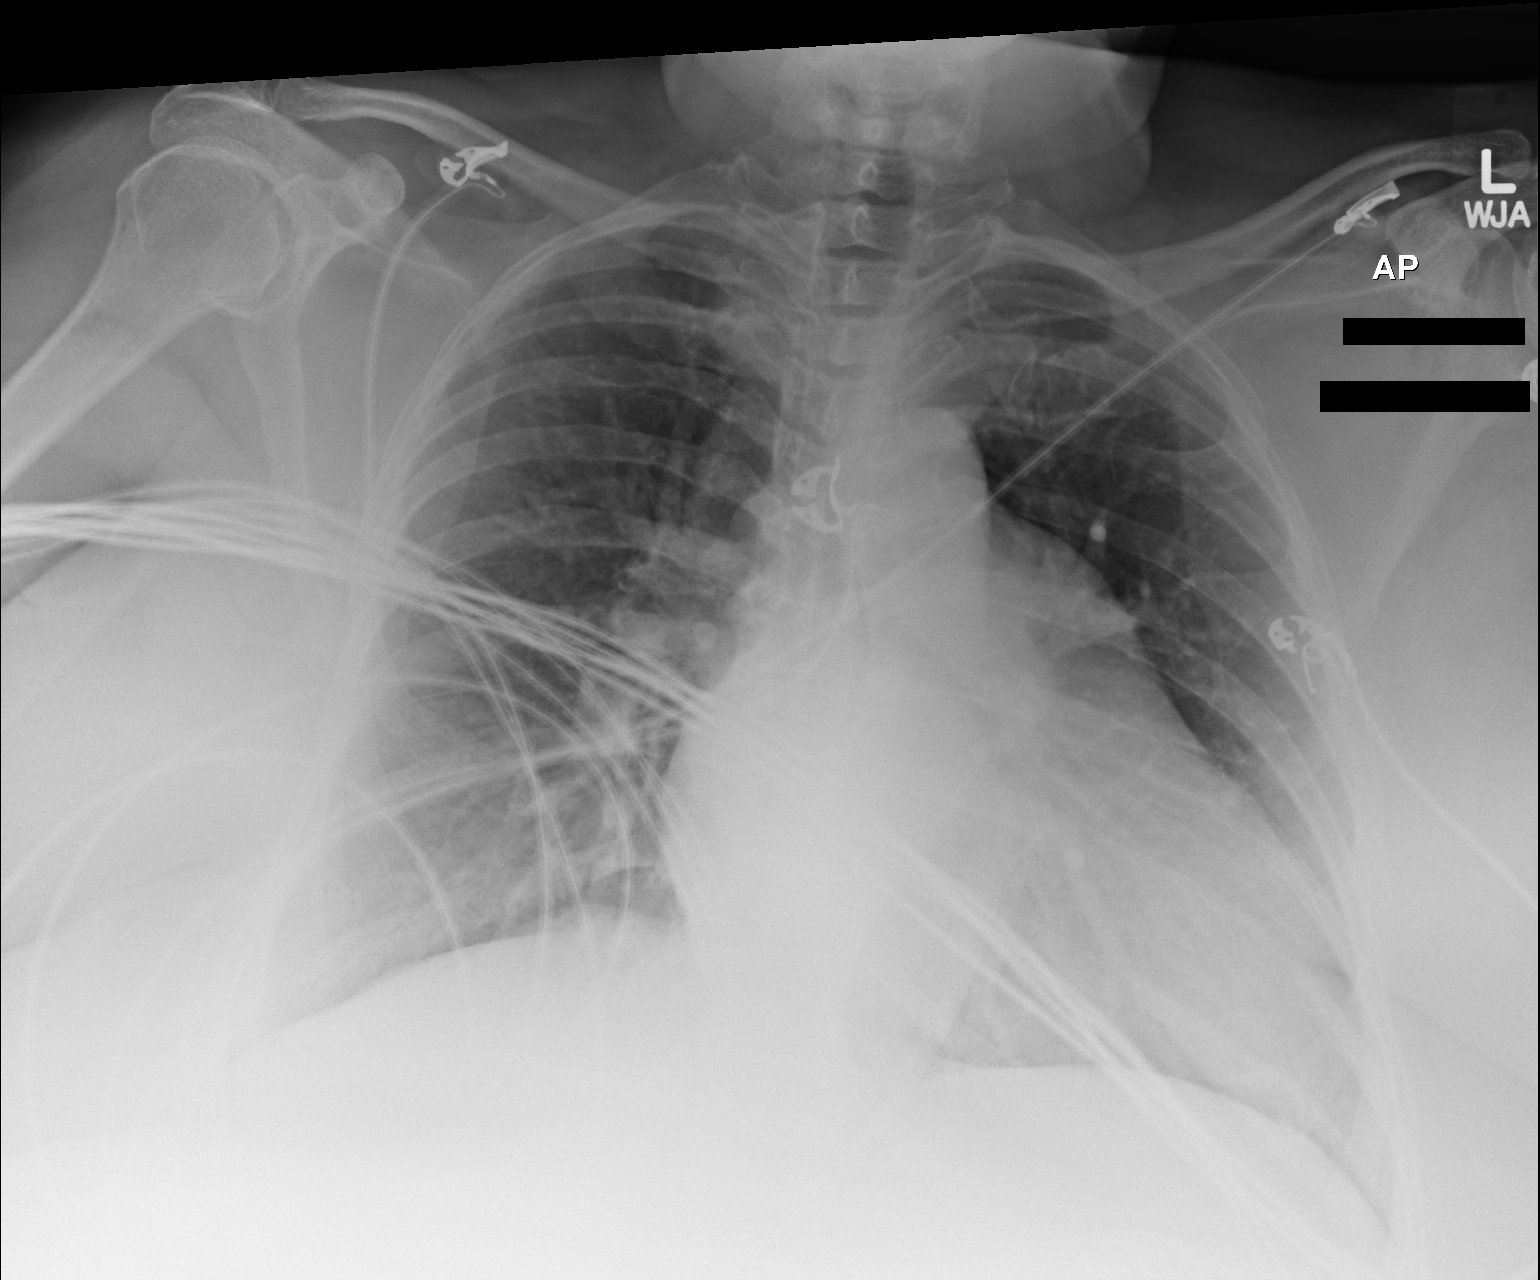

[1 of 1 positions shown; findings below may reference images not displayed]

FINDINGS: Prominent left heart contours and moderate cardiomegaly.

The lungs are clear.  No large effusions.  No pneumothorax.
IMPRESSION: Prominent left heart contours, particularly in the region of the
pulmonary outflow tract. Moderate cardiomegaly.

## 2017-01-04 ENCOUNTER — Ambulatory Visit (INDEPENDENT_AMBULATORY_CARE_PROVIDER_SITE_OTHER): Payer: Self-pay | Admitting: Internal Medicine

## 2017-01-04 ENCOUNTER — Encounter: Payer: Self-pay | Admitting: Internal Medicine

## 2017-01-04 VITALS — BP 138/88 | HR 69 | Ht 67.0 in | Wt 300.4 lb

## 2017-01-04 DIAGNOSIS — I441 Atrioventricular block, second degree: Secondary | ICD-10-CM

## 2017-01-04 DIAGNOSIS — R634 Abnormal weight loss: Secondary | ICD-10-CM

## 2017-01-04 DIAGNOSIS — Z950 Presence of cardiac pacemaker: Secondary | ICD-10-CM

## 2017-01-04 DIAGNOSIS — R0602 Shortness of breath: Secondary | ICD-10-CM

## 2017-01-04 DIAGNOSIS — Z79899 Other long term (current) drug therapy: Secondary | ICD-10-CM

## 2017-01-04 LAB — CUP PACEART INCLINIC DEVICE CHECK
Date Time Interrogation Session: 20180608040000
Implantable Lead Implant Date: 20170414
Implantable Lead Implant Date: 20170414
Implantable Lead Location: 753859
Implantable Lead Location: 753860
Implantable Lead Model: 7740
Implantable Lead Model: 7741
Implantable Lead Serial Number: 662731
Lead Channel Impedance Value: 620 Ohm
Lead Channel Impedance Value: 666 Ohm
Lead Channel Pacing Threshold Amplitude: 0.7 V
Lead Channel Pacing Threshold Amplitude: 0.8 V
Lead Channel Pacing Threshold Pulse Width: 0.4 ms
Lead Channel Setting Pacing Pulse Width: 0.4 ms
Lead Channel Setting Sensing Sensitivity: 2.5 mV
MDC IDC LEAD SERIAL: 754519
MDC IDC MSMT LEADCHNL RA SENSING INTR AMPL: 6.8 mV
MDC IDC MSMT LEADCHNL RV PACING THRESHOLD PULSEWIDTH: 0.4 ms
MDC IDC PG IMPLANT DT: 20170414
MDC IDC PG SERIAL: 743775
MDC IDC SET LEADCHNL RA PACING AMPLITUDE: 2 V
MDC IDC SET LEADCHNL RV PACING AMPLITUDE: 1.4 V
MDC IDC STAT BRADY RA PERCENT PACED: 2 %
MDC IDC STAT BRADY RV PERCENT PACED: 100 %

## 2017-01-04 NOTE — Progress Notes (Signed)
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 She lives in [place] and has just returned [place] the [place] She c/o feeling poorly with fatigue and weakness. No chest pain. She has mild peripheral edema. No syncope. She is frustrated by her inability to lose weight.  Allergies  Allergen Reactions  . Other Anaphylaxis    SEAFOOD     Current Outpatient Prescriptions  Medication Sig Dispense Refill  . acetaminophen (TYLENOL) 325 MG tablet Take 1-2 tablets (325-650 mg total) by mouth every 4 (four) hours as needed for mild pain.    . Amlodipine-Valsartan-HCTZ (EXFORGE HCT) 5-160-12.5 MG TABS Take 2 tablets by mouth daily. 180 tablet 3  . aspirin EC 81 MG tablet Take 81 mg by mouth daily.    . carvedilol (COREG) 6.25 MG tablet Take 1 tablet (6.25 mg total) by mouth 2 (two) times daily. 180 tablet 3  . clopidogrel (PLAVIX) 75 MG tablet Take 1 tablet (75 mg total) by mouth daily. 90 tablet 3  . isosorbide dinitrate (ISORDIL) 2.5 MG SL tablet Place 1 tablet (2.5 mg total) under the tongue every 5 (five) minutes x 3 doses as needed. 100 tablet 3  . rosuvastatin (CRESTOR) 20 MG tablet Take 1 tablet (20 mg total) by mouth daily. 90 tablet 3   No current facility-administered medications for this visit.      Past Medical History:  Diagnosis Date  . CAD (coronary artery disease)    a. presumed CAD - Myoview 4/17 - Low risk stress nuclear study with a large, severe, partially reversible inferior and apical defect consistent with prior infarct and mild peri-infarct ischemia; EF 52 with apical hypokinesis.>> no angina - [facility]  . Heart block 11/12/2015   [employer] Sci PPM placed  . History of echocardiogram    a. Echo 4/17 - Moderate LVH, EF 60-65%, normal wall motion, grade 1 diastolic dysfunction, moderate MR, mild LAE, moderate RAE, mild TR, PASP 30 mmHg  . HLD (hyperlipidemia)   . Hypertension   . Mitral  regurgitation     ROS:   All systems reviewed and negative except as noted in the HPI.   Past Surgical History:  Procedure Laterality Date  . EP IMPLANTABLE DEVICE N/A 11/11/2015   Procedure: Pacemaker Implant;  Surgeon: Marinus MawGregg W Dujuan Stankowski, MD;  [facility];  Service: Cardiovascular;  Laterality: N/A;     No family history on file.   Social History   Social History  . Marital status: Married    Spouse name: N/A  . Number of children: N/A  . Years of education: N/A   Occupational History  . Not on file.   Social History Main Topics  . Smoking status: Never Smoker  . Smokeless tobacco: Never Used  . Alcohol use No  . Drug use: Unknown  . Sexual activity: Not on file   Other Topics Concern  . Not on file   Social History Narrative  . No narrative on file     BP 138/88   Pulse 69   Ht 5\' 7"  (1.702 m)   Wt (!) 300 lb 6.4 oz (136.3 kg)   SpO2 94%   BMI 47.05 kg/m   Physical Exam:  Well appearing but morbidly obese 61 yo woman, NAD HEENT: Unremarkable Neck:  7 cm JVD, no thyromegally Lymphatics:  No adenopathy Back:  No CVA tenderness Lungs:  Clear with no wheezes HEART:  Regular rate rhythm, no murmurs, no rubs, no clicks Abd:  soft, morbidly obese, positive bowel sounds, no organomegally, no rebound, no guarding Ext:  2 plus pulses, no edema, no cyanosis, no clubbing Skin:  No rashes no nodules Neuro:  CN II through XII intact, motor grossly intact  EKG - NSR with ventricular pacing  DEVICE  Normal Boston Sci DDD PM function.  See PaceArt for details.   Assess/Plan: 1. Symptomatic heart block - she has CHB with no escape today. She is s/p PPM 2. Sob and fatigue and weakness - I suspect that this is multifactorial. I have asked her to lose weight and have referred her to our Bariatric surgery program. Will check thyroid function. 3. Morbid obesity - she has gained weight since she had her PPM inserted. I will refer her for bariatric  surgical evaluation. 4. HTN - her blood pressure is mildly elevated. Will follow. She is encouraged to lose weight and reduce her salt intake.  Leonia Reeves.D.

## 2017-01-04 NOTE — Patient Instructions (Addendum)
Medication Instructions:  Your physician recommends that you continue on your current medications as directed. Please refer to the Current Medication list given to you today.   Labwork: None Ordered   Testing/Procedures: TODAY: TSH  Your physician has requested that you have an echocardiogram. Echocardiography is a painless test that uses sound waves to create images of your heart. It provides your doctor with information about the size and shape of your heart and how well your heart's chambers and valves are working. This procedure takes approximately one hour. There are no restrictions for this procedure.     Follow-Up: Your physician recommends you schedule with a Geneticist, [facility]atric Seminar [facility]  Call [facility]  - [phone] - ask for Bariatric Department. You must schedule with the seminar before scheduled consult with the doctor.     Your physician recommends that you schedule a follow-up appointment in: 3 months with Dr. Ladona Ridgelaylor       Any Other Special Instructions Will Be Listed Below (If Applicable).   If you need a refill on your cardiac medications before your next appointment, please call your pharmacy.

## 2017-01-05 LAB — TSH: TSH: 0.815 u[IU]/mL (ref 0.450–4.500)

## 2017-01-22 ENCOUNTER — Other Ambulatory Visit (HOSPITAL_COMMUNITY): Payer: Self-pay

## 2017-02-05 ENCOUNTER — Telehealth: Payer: Self-pay

## 2017-02-05 NOTE — Telephone Encounter (Signed)
Surgical clearance faxed.  Per Dr. Ladona Ridgelaylor, Pt low risk.

## 2017-02-06 ENCOUNTER — Telehealth: Payer: Self-pay | Admitting: Internal Medicine

## 2017-02-06 NOTE — Telephone Encounter (Signed)
Called, spoke with Dr. Buzzy Haneppara. Dr. Buzzy Haneppara stated pt stopped taking Plavix last Thursday (01/30/17) on her own. Pt is scheduled for surgery next week on Tuesday (02/12/17). Dr. Buzzy Haneppara wants to know if it is okay for pt to stay off Plavix until Tuesday? If pt is stays off, would Aspirin 81 mg be recommended? Informed will located faxed clearance document in medical records, to see if Dr. Ladona Ridgelaylor had any recommendations r/t Plavix. Informed someone will call back with recommendation.

## 2017-02-06 NOTE — Telephone Encounter (Signed)
New Message     They did not receive the surgical clearance for pt to have weight loss surgery , can you a refax to [phone]

## 2017-02-06 NOTE — Telephone Encounter (Signed)
Tiffany From [facility] is calling because [name] stopped her Plavix last week and she will not have surgery until the 17th . and Dr. Buzzy Haneppara is wanting to know if its oik for her to stay off of the Plavix for another few days and can she take a baby aspirin .Marland Kitchen. Please call Dr. Buzzy Haneppara on cell #[phone]

## 2017-02-07 NOTE — Telephone Encounter (Signed)
Continue ASA and continue to hold plavix. GT

## 2017-02-08 ENCOUNTER — Other Ambulatory Visit (HOSPITAL_COMMUNITY): Payer: Self-pay

## 2017-02-08 NOTE — Telephone Encounter (Signed)
Call made to Dr. Buzzy Haneppara.  Per Dr. Buzzy Haneppara, Pt had discontinued her Plavix without physician order for surgery.  Dr. Buzzy Haneppara wanted to ensure that was ok for Pt to be off Plavix this long, and asked about taking an ASA.  Per Dr. Ladona Ridgelaylor, ok for Pt to remain of Plavix and recommended a daily ASA.   Provided Dr. Buzzy Haneppara with Dr. Lubertha Basqueaylor's recommendations.  Per Dr. Buzzy Haneppara, he had already advised Pt to take daily ASA, was just confirming ok to be off Plavix this long.  Question confirmed.  Surgical clearance was refaxed 02/07/2017.  Await further needs.

## 2017-02-18 ENCOUNTER — Other Ambulatory Visit (HOSPITAL_COMMUNITY): Payer: Self-pay

## 2017-02-22 ENCOUNTER — Ambulatory Visit (HOSPITAL_COMMUNITY): Payer: Self-pay | Attending: Cardiology

## 2017-02-22 ENCOUNTER — Other Ambulatory Visit: Payer: Self-pay

## 2017-02-22 DIAGNOSIS — I441 Atrioventricular block, second degree: Secondary | ICD-10-CM | POA: Insufficient documentation

## 2017-02-22 DIAGNOSIS — E785 Hyperlipidemia, unspecified: Secondary | ICD-10-CM | POA: Insufficient documentation

## 2017-02-22 DIAGNOSIS — R0602 Shortness of breath: Secondary | ICD-10-CM | POA: Insufficient documentation

## 2017-02-22 DIAGNOSIS — I348 Other nonrheumatic mitral valve disorders: Secondary | ICD-10-CM | POA: Insufficient documentation

## 2017-02-22 DIAGNOSIS — I2510 Atherosclerotic heart disease of native coronary artery without angina pectoris: Secondary | ICD-10-CM | POA: Insufficient documentation

## 2017-02-22 DIAGNOSIS — I119 Hypertensive heart disease without heart failure: Secondary | ICD-10-CM | POA: Insufficient documentation

## 2017-02-22 LAB — ECHOCARDIOGRAM COMPLETE
E decel time: 331 msec
EERAT: 6.19
FS: 28 % (ref 28–44)
IVS/LV PW RATIO, ED: 1.16
LA ID, A-P, ES: 42 mm
LA diam end sys: 42 mm
LA diam index: 1.75 cm/m2
LA vol A4C: 36.6 ml
LA vol index: 21.9 mL/m2
LAVOL: 52.6 mL
LDCA: 3.8 cm2
LV E/e' medial: 6.19
LV E/e'average: 6.19
LV TDI E'LATERAL: 7.29
LV TDI E'MEDIAL: 3.7
LV e' LATERAL: 7.29 cm/s
LVOT VTI: 17.6 cm
LVOT peak vel: 86.5 cm/s
LVOTD: 22 mm
LVOTSV: 67 mL
Lateral S' vel: 11.6 cm/s
MV Dec: 331
MV pk A vel: 89.7 m/s
MV pk E vel: 45.1 m/s
PW: 13.1 mm — AB (ref 0.6–1.1)
RV TAPSE: 20.4 mm

## 2017-02-22 MED ORDER — PERFLUTREN LIPID MICROSPHERE
1.0000 mL | INTRAVENOUS | Status: AC | PRN
  Administered 2017-02-22: 2 mL via INTRAVENOUS

## 2017-03-02 ENCOUNTER — Other Ambulatory Visit: Payer: Self-pay | Admitting: Physician Assistant

## 2017-03-02 DIAGNOSIS — E785 Hyperlipidemia, unspecified: Secondary | ICD-10-CM

## 2017-03-02 DIAGNOSIS — Z950 Presence of cardiac pacemaker: Secondary | ICD-10-CM

## 2017-03-02 DIAGNOSIS — I119 Hypertensive heart disease without heart failure: Secondary | ICD-10-CM

## 2017-03-02 DIAGNOSIS — I2510 Atherosclerotic heart disease of native coronary artery without angina pectoris: Secondary | ICD-10-CM

## 2017-03-11 ENCOUNTER — Encounter: Payer: Self-pay | Admitting: Internal Medicine

## 2017-03-27 ENCOUNTER — Encounter: Payer: Self-pay | Admitting: Internal Medicine

## 2017-03-27 ENCOUNTER — Ambulatory Visit (INDEPENDENT_AMBULATORY_CARE_PROVIDER_SITE_OTHER): Payer: Self-pay | Admitting: Internal Medicine

## 2017-03-27 VITALS — BP 124/76 | HR 76 | Ht 67.0 in | Wt 275.0 lb

## 2017-03-27 DIAGNOSIS — R0602 Shortness of breath: Secondary | ICD-10-CM

## 2017-03-27 DIAGNOSIS — I441 Atrioventricular block, second degree: Secondary | ICD-10-CM

## 2017-03-27 LAB — CUP PACEART INCLINIC DEVICE CHECK
Brady Statistic RA Percent Paced: 13 %
Brady Statistic RV Percent Paced: 100 %
Implantable Lead Implant Date: 20170414
Implantable Lead Implant Date: 20170414
Implantable Lead Location: 753860
Implantable Lead Model: 7740
Implantable Lead Model: 7741
Implantable Lead Serial Number: 754519
Implantable Pulse Generator Implant Date: 20170414
Lead Channel Impedance Value: 598 Ohm
Lead Channel Impedance Value: 598 Ohm
Lead Channel Pacing Threshold Amplitude: 0.8 V
Lead Channel Pacing Threshold Pulse Width: 0.4 ms
Lead Channel Sensing Intrinsic Amplitude: 6.4 mV
Lead Channel Setting Pacing Amplitude: 1.5 V
Lead Channel Setting Sensing Sensitivity: 2.5 mV
MDC IDC LEAD LOCATION: 753859
MDC IDC LEAD SERIAL: 662731
MDC IDC MSMT LEADCHNL RA PACING THRESHOLD AMPLITUDE: 0.7 V
MDC IDC MSMT LEADCHNL RV PACING THRESHOLD PULSEWIDTH: 0.4 ms
MDC IDC PG SERIAL: 743775
MDC IDC SESS DTM: 20180829040000
MDC IDC SET LEADCHNL RA PACING AMPLITUDE: 2 V
MDC IDC SET LEADCHNL RV PACING PULSEWIDTH: 0.4 ms

## 2017-03-27 NOTE — Progress Notes (Signed)
HPI Christine Cross returns today for follow-up. She is a morbidly obese middle-age woman with a history of complete heart block, status post permanent pacemaker insertion, remote myocardial infarction with no significant ischemia on stress testing, who underwent bariatric surgery several weeks ago. Her surgery was uneventful and she is already lost 25 pounds. She denies chest pain or shortness of breath. She had some palpitations a few weeks ago but this is resolved. No other complaints today. Allergies  Allergen Reactions  . Other Anaphylaxis    SEAFOOD     Current Outpatient Prescriptions  Medication Sig Dispense Refill  . acetaminophen (TYLENOL) 325 MG tablet Take 1-2 tablets (325-650 mg total) by mouth every 4 (four) hours as needed for mild pain.    . Amlodipine-Valsartan-HCTZ 5-160-12.5 MG TABS TAKE 2 TABLETS BY MOUTH DAILY 180 tablet 2  . aspirin EC 81 MG tablet Take 81 mg by mouth daily.    . carvedilol (COREG) 6.25 MG tablet Take 1 tablet (6.25 mg total) by mouth 2 (two) times daily. 180 tablet 3  . clopidogrel (PLAVIX) 75 MG tablet Take 1 tablet (75 mg total) by mouth daily. 90 tablet 3  . isosorbide dinitrate (ISORDIL) 2.5 MG SL tablet Place 1 tablet (2.5 mg total) under the tongue every 5 (five) minutes x 3 doses as needed. 100 tablet 3  . rosuvastatin (CRESTOR) 20 MG tablet Take 1 tablet (20 mg total) by mouth daily. 90 tablet 3   No current facility-administered medications for this visit.      Past Medical History:  Diagnosis Date  . CAD (coronary artery disease)    a. presumed CAD - Myoview 4/17 - Low risk stress nuclear study with a large, severe, partially reversible inferior and apical defect consistent with prior infarct and mild peri-infarct ischemia; EF 52 with apical hypokinesis.>> no angina - [facility]  . Heart block 11/12/2015   [employer] Sci PPM placed  . History of echocardiogram    a. Echo 4/17 - Moderate LVH, EF 60-65%, normal wall motion, grade 1 diastolic  dysfunction, moderate MR, mild LAE, moderate RAE, mild TR, PASP 30 mmHg  . HLD (hyperlipidemia)   . Hypertension   . Mitral regurgitation     ROS:   All systems reviewed and negative except as noted in the HPI.   Past Surgical History:  Procedure Laterality Date  . EP IMPLANTABLE DEVICE N/A 11/11/2015   Procedure: Pacemaker Implant;  Surgeon: Marinus Maw, MD;  Location: [facility] INVASIVE CV LAB;  Service: Cardiovascular;  Laterality: N/A;     No family history on file.   Social History   Social History  . Marital status: Married    Spouse name: N/A  . Number of children: N/A  . Years of education: N/A   Occupational History  . Not on file.   Social History Main Topics  . Smoking status: Never Smoker  . Smokeless tobacco: Never Used  . Alcohol use No  . Drug use: No  . Sexual activity: Not on file   Other Topics Concern  . Not on file   Social History Narrative  . No narrative on file     BP 124/76   Pulse 76   Ht 5\' 7"  (1.702 m)   Wt 275 lb (124.7 kg)   SpO2 97%   BMI 43.07 kg/m   Physical Exam:  Well appearing 61 yo woman, NAD HEENT: Unremarkable Neck:  6 cm JVD, no thyromegally Lymphatics:  No adenopathy Back:  No CVA tenderness Lungs:  Clear with no wheezes, rales, or rhonchi HEART:  Regular rate rhythm, no murmurs, no rubs, no clicks Abd:  soft, positive bowel sounds, no organomegally, no rebound, no guarding Ext:  2 plus pulses, no edema, no cyanosis, no clubbing Skin:  No rashes no nodules Neuro:  CN II through XII intact, motor grossly intact  DEVICE  Normal device function.  See PaceArt for details.   Assess/Plan: 1. Complete heart block - she is doing well status post pacemaker insertion 2. Palpitations - interrogation of her pacemaker demonstrates no atrial or ventricular arrhythmias. She has some sinus tachycardia  3. Pacemaker - [employer] dual-chamber pacemaker is working normally. We'll plan to recheck in several months. 4.  Coronary artery disease - she had atypical chest pain and underwent stress testing demonstrating minimally reduced left ventricular function with peri-infarct ischemia. 5. Morbid obesity - she is undergone bariatric surgery and lost 25 pounds. She feels well. Her goal is to lose an additional 75 pounds.  Lewayne Bunting, M.D.

## 2017-03-27 NOTE — Patient Instructions (Addendum)

## 2017-04-29 ENCOUNTER — Telehealth: Payer: Self-pay | Admitting: Internal Medicine

## 2017-04-29 ENCOUNTER — Other Ambulatory Visit: Payer: Self-pay | Admitting: *Deleted

## 2017-04-29 ENCOUNTER — Other Ambulatory Visit: Payer: Self-pay | Admitting: Physician Assistant

## 2017-04-29 MED ORDER — CARVEDILOL 6.25 MG PO TABS: 6.2500 mg | ORAL_TABLET | Freq: Two times a day (BID) | ORAL | 9 refills | Status: DC

## 2017-04-29 NOTE — Telephone Encounter (Signed)
Just wanted to clarify patients carvedilol dose as pharmacy is requesting 12.5mg  but current med list has 6.25 mg. Looks like it should be 6.25 mg. Please advise. Thanks, MI

## 2017-04-29 NOTE — Telephone Encounter (Signed)
New message     *STAT* If patient is at the pharmacy, call can be transferred to refill team.   1. Which medications need to be refilled? (please list name of each medication and dose if known) carvedilol   2. Which pharmacy/location (including street and city if local pharmacy) is medication to be sent to? [facility] on [address] in [place]  3. Do they need a 30 day or 90 day supply? 30 day

## 2017-04-29 NOTE — Telephone Encounter (Signed)
Yes you are right.  Carvedilol 6.25 mg po bid.

## 2017-06-26 ENCOUNTER — Telehealth: Payer: Self-pay | Admitting: Cardiology

## 2017-06-26 ENCOUNTER — Encounter: Payer: Self-pay | Admitting: *Deleted

## 2017-06-26 NOTE — Telephone Encounter (Signed)
Confirmed remote transmission w/ pt daughter.   

## 2017-06-27 ENCOUNTER — Encounter: Payer: Self-pay | Admitting: Cardiology

## 2018-04-23 ENCOUNTER — Other Ambulatory Visit: Payer: Self-pay | Admitting: Internal Medicine

## 2018-04-23 DIAGNOSIS — I119 Hypertensive heart disease without heart failure: Secondary | ICD-10-CM

## 2018-04-23 DIAGNOSIS — I2510 Atherosclerotic heart disease of native coronary artery without angina pectoris: Secondary | ICD-10-CM

## 2018-04-23 DIAGNOSIS — E785 Hyperlipidemia, unspecified: Secondary | ICD-10-CM

## 2018-04-23 DIAGNOSIS — Z950 Presence of cardiac pacemaker: Secondary | ICD-10-CM

## 2018-04-24 ENCOUNTER — Other Ambulatory Visit: Payer: Self-pay

## 2018-04-24 DIAGNOSIS — I2510 Atherosclerotic heart disease of native coronary artery without angina pectoris: Secondary | ICD-10-CM

## 2018-04-24 DIAGNOSIS — Z950 Presence of cardiac pacemaker: Secondary | ICD-10-CM

## 2018-04-24 DIAGNOSIS — I119 Hypertensive heart disease without heart failure: Secondary | ICD-10-CM

## 2018-04-24 DIAGNOSIS — E785 Hyperlipidemia, unspecified: Secondary | ICD-10-CM

## 2018-04-24 MED ORDER — AMLODIPINE-VALSARTAN-HCTZ 5-160-12.5 MG PO TABS: 2.0000 | ORAL_TABLET | Freq: Every day | ORAL | 0 refills | Status: DC

## 2018-04-24 MED ORDER — CARVEDILOL 6.25 MG PO TABS: 6.2500 mg | ORAL_TABLET | Freq: Two times a day (BID) | ORAL | 0 refills | Status: DC

## 2018-07-16 ENCOUNTER — Encounter: Payer: Self-pay | Admitting: Internal Medicine

## 2018-07-16 DIAGNOSIS — R0989 Other specified symptoms and signs involving the circulatory and respiratory systems: Secondary | ICD-10-CM

## 2018-07-17 ENCOUNTER — Encounter: Payer: Self-pay | Admitting: Internal Medicine

## 2018-08-18 ENCOUNTER — Ambulatory Visit (INDEPENDENT_AMBULATORY_CARE_PROVIDER_SITE_OTHER): Payer: Self-pay

## 2018-08-18 DIAGNOSIS — I441 Atrioventricular block, second degree: Secondary | ICD-10-CM

## 2018-08-18 NOTE — Progress Notes (Signed)
Remote pacemaker transmission.   

## 2018-08-20 LAB — CUP PACEART REMOTE DEVICE CHECK
Brady Statistic RA Percent Paced: 14 %
Brady Statistic RV Percent Paced: 100 %
Implantable Lead Implant Date: 20170414
Implantable Lead Implant Date: 20170414
Implantable Lead Location: 753860
Implantable Lead Model: 7740
Implantable Lead Serial Number: 662731
Implantable Pulse Generator Implant Date: 20170414
Lead Channel Impedance Value: 570 Ohm
Lead Channel Impedance Value: 605 Ohm
Lead Channel Pacing Threshold Amplitude: 1 V
Lead Channel Pacing Threshold Pulse Width: 0.4 ms
Lead Channel Pacing Threshold Pulse Width: 0.4 ms
Lead Channel Setting Pacing Amplitude: 1.4 V
Lead Channel Setting Sensing Sensitivity: 2.5 mV
MDC IDC LEAD LOCATION: 753859
MDC IDC LEAD SERIAL: 754519
MDC IDC MSMT BATTERY REMAINING LONGEVITY: 138 mo
MDC IDC MSMT BATTERY REMAINING PERCENTAGE: 100 %
MDC IDC MSMT LEADCHNL RA PACING THRESHOLD AMPLITUDE: 0.6 V
MDC IDC PG SERIAL: 743775
MDC IDC SESS DTM: 20200120153200
MDC IDC SET LEADCHNL RA PACING AMPLITUDE: 2 V
MDC IDC SET LEADCHNL RV PACING PULSEWIDTH: 0.4 ms

## 2018-08-26 ENCOUNTER — Ambulatory Visit (INDEPENDENT_AMBULATORY_CARE_PROVIDER_SITE_OTHER): Payer: Self-pay | Admitting: Internal Medicine

## 2018-08-26 ENCOUNTER — Encounter: Payer: Self-pay | Admitting: Internal Medicine

## 2018-08-26 VITALS — BP 142/88 | HR 61 | Ht 67.0 in | Wt 259.6 lb

## 2018-08-26 DIAGNOSIS — E785 Hyperlipidemia, unspecified: Secondary | ICD-10-CM

## 2018-08-26 DIAGNOSIS — I441 Atrioventricular block, second degree: Secondary | ICD-10-CM

## 2018-08-26 DIAGNOSIS — Z950 Presence of cardiac pacemaker: Secondary | ICD-10-CM

## 2018-08-26 DIAGNOSIS — I119 Hypertensive heart disease without heart failure: Secondary | ICD-10-CM

## 2018-08-26 DIAGNOSIS — I2510 Atherosclerotic heart disease of native coronary artery without angina pectoris: Secondary | ICD-10-CM

## 2018-08-26 MED ORDER — CLOPIDOGREL BISULFATE 75 MG PO TABS: 75.0000 mg | ORAL_TABLET | Freq: Every day | ORAL | 3 refills | Status: DC

## 2018-08-26 MED ORDER — AMLODIPINE-VALSARTAN-HCTZ 5-160-12.5 MG PO TABS: 2.0000 | ORAL_TABLET | Freq: Every day | ORAL | 3 refills | Status: DC

## 2018-08-26 MED ORDER — ROSUVASTATIN CALCIUM 20 MG PO TABS: 20.0000 mg | ORAL_TABLET | Freq: Every day | ORAL | 3 refills | Status: DC

## 2018-08-26 MED ORDER — CARVEDILOL 6.25 MG PO TABS: 6.2500 mg | ORAL_TABLET | Freq: Two times a day (BID) | ORAL | 3 refills | Status: DC

## 2018-08-26 NOTE — Patient Instructions (Addendum)
Medication Instructions:  Your physician recommends that you continue on your current medications as directed. Please refer to the Current Medication list given to you today.  May take ibuprofen for chest pain as needed.  Labwork: None ordered.  Testing/Procedures: None ordered.  Follow-Up: Your physician wants you to follow-up in: one year with Dr.Taylor.   You will receive a reminder letter in the mail two months in advance. If you don't receive a letter, please call our office to schedule the follow-up appointment.  Remote monitoring is used to monitor your Pacemaker from home. This monitoring reduces the number of office visits required to check your device to one time per year. It allows [place] to keep an eye on the functioning of your device to ensure it is working properly. You are scheduled for a device check from home on 11/17/2018. You may send your transmission at any time that day. If you have a wireless device, the transmission will be sent automatically. After your physician reviews your transmission, you will receive a postcard with your next transmission date.  Any Other Special Instructions Will Be Listed Below (If Applicable).  If you need a refill on your cardiac medications before your next appointment, please call your pharmacy.

## 2018-08-26 NOTE — Progress Notes (Signed)
HPI [name] returns today for follow-up. She is a morbidly obese middle-age woman with a history of complete heart block, status post permanent pacemaker insertion, remote myocardial infarction with no significant ischemia on stress testing, who underwent bariatric surgery several weeks ago.  She is already lost 40 pounds. She denies chest pain or shortness of breath. She had some palpitations a few weeks ago but this is resolved. No other complaints today. Allergies  Allergen Reactions  . Other Anaphylaxis    SEAFOOD     Current Outpatient Medications  Medication Sig Dispense Refill  . acetaminophen (TYLENOL) 325 MG tablet Take 1-2 tablets (325-650 mg total) by mouth every 4 (four) hours as needed for mild pain.    Marland Kitchen. amLODIPine-Valsartan-HCTZ 5-160-12.5 MG TABS Take 2 tablets by mouth daily. 180 tablet 0  . aspirin EC 81 MG tablet Take 81 mg by mouth daily.    . carvedilol (COREG) 6.25 MG tablet Take 1 tablet (6.25 mg total) by mouth 2 (two) times daily. 180 tablet 0  . clopidogrel (PLAVIX) 75 MG tablet Take 1 tablet (75 mg total) by mouth daily. 90 tablet 3  . isosorbide dinitrate (ISORDIL) 2.5 MG SL tablet Place 1 tablet (2.5 mg total) under the tongue every 5 (five) minutes x 3 doses as needed. 100 tablet 3  . rosuvastatin (CRESTOR) 20 MG tablet Take 1 tablet (20 mg total) by mouth daily. 90 tablet 3   No current facility-administered medications for this visit.      Past Medical History:  Diagnosis Date  . CAD (coronary artery disease)    a. presumed CAD - Myoview 4/17 - Low risk stress nuclear study with a large, severe, partially reversible inferior and apical defect consistent with prior infarct and mild peri-infarct ischemia; EF 52 with apical hypokinesis.>> no angina - [facility]  . Heart block 11/12/2015   [employer] Sci PPM placed  . History of echocardiogram    a. Echo 4/17 - Moderate LVH, EF 60-65%, normal wall motion, grade 1 diastolic dysfunction, moderate MR, mild  LAE, moderate RAE, mild TR, PASP 30 mmHg  . HLD (hyperlipidemia)   . Hypertension   . Mitral regurgitation     ROS:   All systems reviewed and negative except as noted in the HPI.   Past Surgical History:  Procedure Laterality Date  . EP IMPLANTABLE DEVICE N/A 11/11/2015   Procedure: Pacemaker Implant;  Surgeon: Marinus MawGregg W Taylor, MD;  [facility];  Service: Cardiovascular;  Laterality: N/A;     History reviewed. No pertinent family history.   Social History   Socioeconomic History  . Marital status: Married    Spouse name: Not on file  . Number of children: Not on file  . Years of education: Not on file  . Highest education level: Not on file  Occupational History  . Not on file  Social Needs  . Financial resource strain: Not on file  . Food insecurity:    Worry: Not on file    Inability: Not on file  . Transportation needs:    Medical: Not on file    Non-medical: Not on file  Tobacco Use  . Smoking status: Never Smoker  . Smokeless tobacco: Never Used  Substance and Sexual Activity  . Alcohol use: No  . Drug use: No  . Sexual activity: Not on file  Lifestyle  . Physical activity:    Days per week: Not on file    Minutes per  session: Not on file  . Stress: Not on file  Relationships  . Social connections:    Talks on phone: Not on file    Gets together: Not on file    Attends religious service: Not on file    Active member of club or organization: Not on file    Attends meetings of clubs or organizations: Not on file    Relationship status: Not on file  . Intimate partner violence:    Fear of current or ex partner: Not on file    Emotionally abused: Not on file    Physically abused: Not on file    Forced sexual activity: Not on file  Other Topics Concern  . Not on file  Social History Narrative  . 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  See PaceArt for details.   Assess/Plan: 1. CHB - she has no escape today and is asymptomatic, s/p PPM insertion. 2. PPM - her boston Sci device is working normally. We will recheck in several months. 3. Obesity - she continues to lose weight but not as fast as she would like.  4. CAD - she has non-cardiac chest pain but no angina.

## 2018-09-23 LAB — CUP PACEART INCLINIC DEVICE CHECK
Implantable Lead Implant Date: 20170414
Implantable Lead Location: 753859
Implantable Lead Model: 7741
Implantable Lead Serial Number: 754519
MDC IDC LEAD IMPLANT DT: 20170414
MDC IDC LEAD LOCATION: 753860
MDC IDC LEAD SERIAL: 662731
MDC IDC PG IMPLANT DT: 20170414
MDC IDC SESS DTM: 20200225162603
Pulse Gen Serial Number: 743775

## 2018-11-17 ENCOUNTER — Encounter: Payer: Self-pay | Admitting: *Deleted

## 2018-11-17 ENCOUNTER — Other Ambulatory Visit: Payer: Self-pay

## 2018-11-18 ENCOUNTER — Telehealth: Payer: Self-pay

## 2018-11-18 NOTE — Telephone Encounter (Signed)
Spoke with patient to remind of missed remote transmission 

## 2018-11-24 ENCOUNTER — Encounter: Payer: Self-pay | Admitting: Cardiology

## 2019-10-22 ENCOUNTER — Other Ambulatory Visit: Payer: Self-pay | Admitting: Internal Medicine

## 2019-10-22 DIAGNOSIS — I2510 Atherosclerotic heart disease of native coronary artery without angina pectoris: Secondary | ICD-10-CM

## 2019-10-22 DIAGNOSIS — Z950 Presence of cardiac pacemaker: Secondary | ICD-10-CM

## 2019-10-22 DIAGNOSIS — I119 Hypertensive heart disease without heart failure: Secondary | ICD-10-CM

## 2019-10-22 DIAGNOSIS — E785 Hyperlipidemia, unspecified: Secondary | ICD-10-CM

## 2019-10-26 ENCOUNTER — Other Ambulatory Visit: Payer: Self-pay | Admitting: Internal Medicine

## 2019-10-26 DIAGNOSIS — I119 Hypertensive heart disease without heart failure: Secondary | ICD-10-CM

## 2019-10-26 DIAGNOSIS — I2510 Atherosclerotic heart disease of native coronary artery without angina pectoris: Secondary | ICD-10-CM

## 2019-10-26 DIAGNOSIS — E785 Hyperlipidemia, unspecified: Secondary | ICD-10-CM

## 2019-10-26 DIAGNOSIS — Z950 Presence of cardiac pacemaker: Secondary | ICD-10-CM

## 2019-10-26 MED ORDER — CARVEDILOL 6.25 MG PO TABS: 6.2500 mg | ORAL_TABLET | Freq: Two times a day (BID) | ORAL | 0 refills | Status: DC

## 2019-10-26 MED ORDER — AMLODIPINE-VALSARTAN-HCTZ 5-160-12.5 MG PO TABS: 2.0000 | ORAL_TABLET | Freq: Every day | ORAL | 0 refills | Status: DC

## 2019-10-26 MED ORDER — ROSUVASTATIN CALCIUM 20 MG PO TABS: 20.0000 mg | ORAL_TABLET | Freq: Every day | ORAL | 0 refills | Status: DC

## 2019-10-26 NOTE — Telephone Encounter (Signed)
  *  STAT* If patient is at the pharmacy, call can be transferred to refill team.   1. Which medications need to be refilled? (please list name of each medication and dose if known) amLODIPine-Valsartan-HCTZ 5-160-12.5 MG TABS, carvedilol (COREG) 6.25 MG tablet, clopidogrel (PLAVIX) 75 MG tablet, rosuvastatin (CRESTOR) 20 MG tablet  2. Which pharmacy/location (including street and city if local pharmacy) is medication to be sent to? [facility], [address] [place], [place] [address]  3. Do they need a 30 day or 90 day supply? 90 days  Pt scheduled appt on 11/17/19

## 2019-10-27 ENCOUNTER — Telehealth: Payer: Self-pay | Admitting: Internal Medicine

## 2019-10-27 DIAGNOSIS — Z950 Presence of cardiac pacemaker: Secondary | ICD-10-CM

## 2019-10-27 DIAGNOSIS — I2510 Atherosclerotic heart disease of native coronary artery without angina pectoris: Secondary | ICD-10-CM

## 2019-10-27 DIAGNOSIS — I119 Hypertensive heart disease without heart failure: Secondary | ICD-10-CM

## 2019-10-27 DIAGNOSIS — E785 Hyperlipidemia, unspecified: Secondary | ICD-10-CM

## 2019-10-27 NOTE — Telephone Encounter (Signed)
Pt is over due for an appt.May we fill cardiac meds. Pt will not be back in the country until Dec.and will be seen at that time Will forward to Dr Ladona Ridgel for review .Zack Seal

## 2019-10-27 NOTE — Telephone Encounter (Signed)
New Message  Patient's daughter is calling in to get some assistance with getting 90 day supply for medications for the patient with refills. States that patient lives in [place] and will not be back in the country until December. Patient's daughter has been informed that appointment is needed to get a refill for medications. Patient's daughter would like to speak with a nurse or Dr. Ladona Ridgel to explain. Please give patient a call back to assist.

## 2019-10-27 NOTE — Telephone Encounter (Signed)
Ok to fill. Schedule followup when back in country. GT

## 2019-10-28 MED ORDER — ROSUVASTATIN CALCIUM 20 MG PO TABS: 20.0000 mg | ORAL_TABLET | Freq: Every day | ORAL | 3 refills | Status: AC

## 2019-10-28 MED ORDER — CARVEDILOL 6.25 MG PO TABS: 6.2500 mg | ORAL_TABLET | Freq: Two times a day (BID) | ORAL | 3 refills | Status: AC

## 2019-10-28 MED ORDER — CLOPIDOGREL BISULFATE 75 MG PO TABS: 75.0000 mg | ORAL_TABLET | Freq: Every day | ORAL | 3 refills | Status: AC

## 2019-10-28 MED ORDER — AMLODIPINE-VALSARTAN-HCTZ 5-160-12.5 MG PO TABS: 2.0000 | ORAL_TABLET | Freq: Every day | ORAL | 3 refills | Status: DC

## 2019-10-28 NOTE — Telephone Encounter (Signed)
Per Dr. Junie Spencer to fill meds.  Will need to see Pt in December when back in country.

## 2019-10-28 NOTE — Addendum Note (Signed)
Addended by: Roney Mans A on: 10/28/2019 09:19 AM   Modules accepted: Orders

## 2019-10-29 ENCOUNTER — Other Ambulatory Visit: Payer: Self-pay | Admitting: Internal Medicine

## 2019-11-02 ENCOUNTER — Other Ambulatory Visit: Payer: Self-pay

## 2019-11-02 ENCOUNTER — Telehealth: Payer: Self-pay | Admitting: Internal Medicine

## 2019-11-02 DIAGNOSIS — E785 Hyperlipidemia, unspecified: Secondary | ICD-10-CM

## 2019-11-02 DIAGNOSIS — Z950 Presence of cardiac pacemaker: Secondary | ICD-10-CM

## 2019-11-02 DIAGNOSIS — I2510 Atherosclerotic heart disease of native coronary artery without angina pectoris: Secondary | ICD-10-CM

## 2019-11-02 DIAGNOSIS — I119 Hypertensive heart disease without heart failure: Secondary | ICD-10-CM

## 2019-11-02 MED ORDER — VALSARTAN-HYDROCHLOROTHIAZIDE 320-25 MG PO TABS: 1.0000 | ORAL_TABLET | Freq: Every day | ORAL | 3 refills | Status: DC

## 2019-11-02 MED ORDER — AMLODIPINE BESYLATE 10 MG PO TABS: 10.0000 mg | ORAL_TABLET | Freq: Every day | ORAL | 3 refills | Status: DC

## 2019-11-02 MED ORDER — AMLODIPINE BESYLATE 10 MG PO TABS: 10.0000 mg | ORAL_TABLET | Freq: Every day | ORAL | 3 refills | Status: AC

## 2019-11-02 MED ORDER — VALSARTAN-HYDROCHLOROTHIAZIDE 320-25 MG PO TABS: 1.0000 | ORAL_TABLET | Freq: Every day | ORAL | 3 refills | Status: AC

## 2019-11-02 NOTE — Telephone Encounter (Signed)
New Message      *STAT* If patient is at the pharmacy, call can be transferred to refill team.   1. Which medications need to be refilled? (please liname of each medication and dose if known)   amLODIPine-Valsartan-HCTZ 5-160-12.5 MG TABS  Pts daughter says they dont carry that anymore and that the pharmacy is wanting the medications to be written individually      2. Which pharmacy/location (including street and city if local pharmacy) is medication to be sent to? [facility] on [address],  [place]   3. Do they need a 30 day or 90 day supply? 90

## 2019-11-02 NOTE — Telephone Encounter (Signed)
Refilled medications as requested-separated amlodipine from valsartan-HCTZ

## 2019-11-02 NOTE — Addendum Note (Signed)
Addended by: Roney Mans A on: 11/02/2019 04:24 PM   Modules accepted: Orders

## 2019-11-17 ENCOUNTER — Encounter: Payer: Self-pay | Admitting: Internal Medicine

## 2019-12-29 ENCOUNTER — Ambulatory Visit (INDEPENDENT_AMBULATORY_CARE_PROVIDER_SITE_OTHER): Payer: Self-pay | Admitting: *Deleted

## 2019-12-29 DIAGNOSIS — I441 Atrioventricular block, second degree: Secondary | ICD-10-CM

## 2019-12-30 LAB — CUP PACEART REMOTE DEVICE CHECK
Battery Remaining Longevity: 108 mo
Battery Remaining Percentage: 100 %
Brady Statistic RA Percent Paced: 5 %
Brady Statistic RV Percent Paced: 100 %
Date Time Interrogation Session: 20210527170100
Implantable Lead Implant Date: 20170414
Implantable Lead Implant Date: 20170414
Implantable Lead Location: 753859
Implantable Lead Location: 753860
Implantable Lead Model: 7740
Implantable Lead Model: 7741
Implantable Lead Serial Number: 662731
Implantable Lead Serial Number: 754519
Implantable Pulse Generator Implant Date: 20170414
Lead Channel Impedance Value: 582 Ohm
Lead Channel Impedance Value: 667 Ohm
Lead Channel Pacing Threshold Amplitude: 0.5 V
Lead Channel Pacing Threshold Amplitude: 1.3 V
Lead Channel Pacing Threshold Pulse Width: 0.4 ms
Lead Channel Pacing Threshold Pulse Width: 0.4 ms
Lead Channel Setting Pacing Amplitude: 1.8 V
Lead Channel Setting Pacing Amplitude: 2 V
Lead Channel Setting Pacing Pulse Width: 0.4 ms
Lead Channel Setting Sensing Sensitivity: 2.5 mV
Pulse Gen Serial Number: 743775

## 2019-12-30 NOTE — Progress Notes (Signed)
Remote pacemaker transmission.   

## 2021-07-10 ENCOUNTER — Ambulatory Visit: Payer: Self-pay

## 2021-08-15 LAB — CUP PACEART REMOTE DEVICE CHECK
Battery Remaining Longevity: 96 mo
Battery Remaining Percentage: 94 %
Brady Statistic RA Percent Paced: 5 %
Brady Statistic RV Percent Paced: 100 %
Date Time Interrogation Session: 20221212065600
Implantable Lead Implant Date: 20170414
Implantable Lead Implant Date: 20170414
Implantable Lead Location: 753859
Implantable Lead Location: 753860
Implantable Lead Model: 7740
Implantable Lead Model: 7741
Implantable Lead Serial Number: 662731
Implantable Lead Serial Number: 754519
Implantable Pulse Generator Implant Date: 20170414
Lead Channel Impedance Value: 586 Ohm
Lead Channel Impedance Value: 761 Ohm
Lead Channel Pacing Threshold Amplitude: 0.6 V
Lead Channel Pacing Threshold Amplitude: 1.3 V
Lead Channel Pacing Threshold Pulse Width: 0.4 ms
Lead Channel Pacing Threshold Pulse Width: 0.4 ms
Lead Channel Setting Pacing Amplitude: 1.5 V
Lead Channel Setting Pacing Amplitude: 2 V
Lead Channel Setting Pacing Pulse Width: 0.4 ms
Lead Channel Setting Sensing Sensitivity: 2.5 mV
Pulse Gen Serial Number: 743775

## 2021-09-29 ENCOUNTER — Telehealth: Payer: Self-pay

## 2021-09-29 NOTE — Telephone Encounter (Signed)
Called daughter and she verified that patient left country when Covid hit and has been out of country since then and when she returns the daughter will request patient's records to have care transferred somewhere else. I confirmed I will make patient inactive and what that meant and that she would receive no further phone calls about this. Daughter agreed and was thankful. Daughter on [place]. ?

## 2021-10-09 ENCOUNTER — Ambulatory Visit (INDEPENDENT_AMBULATORY_CARE_PROVIDER_SITE_OTHER): Payer: Self-pay

## 2021-10-09 DIAGNOSIS — I441 Atrioventricular block, second degree: Secondary | ICD-10-CM

## 2021-10-10 LAB — CUP PACEART REMOTE DEVICE CHECK
Battery Remaining Longevity: 90 mo
Battery Remaining Percentage: 90 %
Brady Statistic RA Percent Paced: 6 %
Brady Statistic RV Percent Paced: 100 %
Date Time Interrogation Session: 20230313002200
Implantable Lead Implant Date: 20170414
Implantable Lead Implant Date: 20170414
Implantable Lead Location: 753859
Implantable Lead Location: 753860
Implantable Lead Model: 7740
Implantable Lead Model: 7741
Implantable Lead Serial Number: 662731
Implantable Lead Serial Number: 754519
Implantable Pulse Generator Implant Date: 20170414
Lead Channel Impedance Value: 601 Ohm
Lead Channel Impedance Value: 760 Ohm
Lead Channel Pacing Threshold Amplitude: 0.6 V
Lead Channel Pacing Threshold Amplitude: 1.1 V
Lead Channel Pacing Threshold Pulse Width: 0.4 ms
Lead Channel Pacing Threshold Pulse Width: 0.4 ms
Lead Channel Setting Pacing Amplitude: 1.5 V
Lead Channel Setting Pacing Amplitude: 2 V
Lead Channel Setting Pacing Pulse Width: 0.4 ms
Lead Channel Setting Sensing Sensitivity: 2.5 mV
Pulse Gen Serial Number: 743775

## 2021-10-19 NOTE — Progress Notes (Signed)
Remote pacemaker transmission.
